# Patient Record
Sex: Male | Born: 1975
Health system: Southern US, Community
[De-identification: ages and names within clinical notes are randomized; demographics above are authoritative.]

## PROBLEM LIST (undated history)

## (undated) DIAGNOSIS — Z6828 Body mass index (BMI) 28.0-28.9, adult: Secondary | ICD-10-CM

## (undated) DIAGNOSIS — E785 Hyperlipidemia, unspecified: Secondary | ICD-10-CM

## (undated) DIAGNOSIS — E119 Type 2 diabetes mellitus without complications: Secondary | ICD-10-CM

## (undated) DIAGNOSIS — I10 Essential (primary) hypertension: Secondary | ICD-10-CM

## (undated) HISTORY — DX: Body mass index (bmi) 28.0-28.9, adult: Z68.28

## (undated) HISTORY — DX: Essential (primary) hypertension: I10

## (undated) HISTORY — DX: Hyperlipidemia, unspecified: E78.5

## (undated) HISTORY — DX: Type 2 diabetes mellitus without complications: E11.9

---

## 2010-09-18 ENCOUNTER — Other Ambulatory Visit: Payer: Self-pay | Admitting: Family Medicine

## 2010-09-18 ENCOUNTER — Ambulatory Visit
Admission: RE | Admit: 2010-09-18 | Discharge: 2010-09-18 | Payer: Self-pay | Source: Home / Self Care | Attending: Family Medicine | Admitting: Family Medicine

## 2010-09-18 DIAGNOSIS — J309 Allergic rhinitis, unspecified: Secondary | ICD-10-CM | POA: Insufficient documentation

## 2010-09-18 DIAGNOSIS — L708 Other acne: Secondary | ICD-10-CM | POA: Insufficient documentation

## 2010-09-18 LAB — CBC WITH DIFFERENTIAL/PLATELET
Basophils Relative: 0.8 % (ref 0.0–3.0)
Eosinophils Relative: 2.4 % (ref 0.0–5.0)
HCT: 45.9 % (ref 39.0–52.0)
Hemoglobin: 15.6 g/dL (ref 13.0–17.0)
Lymphocytes Relative: 36.4 % (ref 12.0–46.0)
Lymphs Abs: 3.1 10*3/uL (ref 0.7–4.0)
Monocytes Relative: 6.5 % (ref 3.0–12.0)
Neutro Abs: 4.6 10*3/uL (ref 1.4–7.7)
RBC: 5.4 Mil/uL (ref 4.22–5.81)

## 2010-09-18 LAB — HEPATIC FUNCTION PANEL
ALT: 35 U/L (ref 0–53)
AST: 32 U/L (ref 0–37)
Alkaline Phosphatase: 59 U/L (ref 39–117)
Bilirubin, Direct: 0.1 mg/dL (ref 0.0–0.3)
Total Protein: 7.4 g/dL (ref 6.0–8.3)

## 2010-09-18 LAB — BASIC METABOLIC PANEL
Calcium: 9.8 mg/dL (ref 8.4–10.5)
GFR: 105.71 mL/min (ref >60.00)
Potassium: 4.3 mEq/L (ref 3.5–5.1)
Sodium: 142 mEq/L (ref 135–145)

## 2010-09-18 LAB — LIPID PANEL
HDL: 45.3 mg/dL (ref >39.00)
Total CHOL/HDL Ratio: 5
Triglycerides: 254 mg/dL — ABNORMAL HIGH (ref 0.0–149.0)

## 2010-09-18 LAB — CONVERTED CEMR LAB
Bilirubin Urine: NEGATIVE
Nitrite: NEGATIVE
Protein, U semiquant: NEGATIVE
Urobilinogen, UA: 0.2
WBC Urine, dipstick: NEGATIVE

## 2010-09-24 NOTE — Assessment & Plan Note (Signed)
Summary: NEW TO EST---REQUESTING CPX//CCM//pt resc/ccm   Vital Signs:  Patient profile:   35 year old male Height:      67.5 inches Weight:      186 pounds BMI:     28.81 Temp:     98.8 degrees F oral BP sitting:   124 / 84  (left arm) Cuff size:   regular  Vitals Entered By: Kern Reap CMA Duncan Dull) (September 18, 2010 3:02 PM) CC: new to establish care, left ear clogged Is Patient Diabetic? No Pain Assessment Patient in pain? no        CC:  new to establish care and left ear clogged.  History of Present Illness: Martin Sanchez is a 35 year old, married male, nonsmoker, who comes in today as a new patient for general medical exam,  [facility], [facility] problems.  It one time when he was playing soccer in [place].  He fractured his left elbow had surgery.  He does have some allergic rhinitis, which he takes Zyrtec, and steroid nasal spray p.r.n..  Review of systems otherwise negative.  Family history pertinent.  His father has diabetes.  Tetanus 2007.  Social history married one child .......Art gallery manager  by trade  He also has adult acne  Preventive Screening-Counseling & Management  Alcohol-Tobacco     Smoking Status: never      Drug Use:  no.    Past History:  Past medical, surgical, family and social histories (including risk factors) reviewed, and no changes noted (except as noted below).  Past Surgical History: left elbow surgery  Family History: Reviewed history and no changes required. Father: dabetes Mother: thyroid, low blood pressure Siblings: 1  brother  Social History: Reviewed history and no changes required. Occupation:engineer  Married Never Smoked Alcohol use-no Drug use-no Smoking Status:  never Drug Use:  no  Review of Systems      See HPI  Physical Exam  General:  Well-developed,well-nourished,in no acute distress; alert,appropriate and cooperative throughout examination Head:  Normocephalic and  atraumatic without obvious abnormalities. No apparent alopecia or balding. Eyes:  No corneal or conjunctival inflammation noted. EOMI. Perrla. Funduscopic exam benign, without hemorrhages, exudates or papilledema. Vision grossly normal. Ears:  External ear exam shows no significant lesions or deformities.  Otoscopic examination reveals clear canals, tympanic membranes are intact bilaterally without bulging, retraction, inflammation or discharge. Hearing is grossly normal bilaterally. Nose:  External nasal examination shows no deformity or inflammation. Nasal mucosa are pink and moist without lesions or exudates. Mouth:  Oral mucosa and oropharynx without lesions or exudates.  Teeth in good repair. Neck:  No deformities, masses, or tenderness noted. Chest Wall:  No deformities, masses, tenderness or gynecomastia noted. Breasts:  No masses or gynecomastia noted Lungs:  Normal respiratory effort, chest expands symmetrically. Lungs are clear to auscultation, no crackles or wheezes. Heart:  Normal rate and regular rhythm. S1 and S2 normal without gallop, murmur, click, rub or other extra sounds. Abdomen:  Bowel sounds positive,abdomen soft and non-tender without masses, organomegaly or hernias noted. Rectal:  No external abnormalities noted. Normal sphincter tone. No rectal masses or tenderness. Msk:  No deformity or scoliosis noted of thoracic or lumbar spine.   Pulses:  R and L carotid,radial,femoral,dorsalis pedis and posterior tibial pulses are full and equal bilaterally Extremities:  No clubbing, cyanosis, edema, or deformity noted with normal full range of motion of all joints.   Neurologic:  No cranial nerve deficits noted. Station and gait are normal. Plantar  reflexes are down-going bilaterally. DTRs are symmetrical throughout. Sensory, motor and coordinative functions appear intact. Skin:  Intact without suspicious lesions or rashes Cervical Nodes:  No lymphadenopathy noted Axillary Nodes:  No  palpable lymphadenopathy Inguinal Nodes:  No significant adenopathy Psych:  Cognition and judgment appear intact. Alert and cooperative with normal attention span and concentration. No apparent delusions, illusions, hallucinations   Problems:  Medical Problems Added: 1)  Dx of Acne Nec  (ICD-706.1) 2)  Dx of Routine General Medical Exam@[facility]  (ICD-V70.0) 3)  Dx of Rhinitis  (ICD-477.9)  Impression & Recommendations:  Problem # 1:  Preventive Health Care (ICD-V70.0) Assessment New  Problem # 2:  RHINITIS (ICD-477.9) Assessment: New  Orders: Venipuncture (40102) Prescription Created Electronically [phone]) UA Dipstick w/o Micro (automated)  (81003) Specimen Handling (64403) TLB-Lipid Panel (80061-LIPID) TLB-BMP (Basic Metabolic Panel-BMET) (80048-METABOL) TLB-CBC Platelet - w/Differential (85025-CBCD) TLB-TSH (Thyroid Stimulating Hormone) (84443-TSH) TLB-Hepatic/Liver Function Pnl (80076-HEPATIC)  His updated medication list for this problem includes:    Flonase 50 Mcg/act Susp (Fluticasone propionate) ..... Uad  Problem # 3:  ACNE NEC (ICD-706.1) Assessment: New  His updated medication list for this problem includes:    Doxycycline Hyclate 100 Mg Caps (Doxycycline hyclate) .Marland Kitchen... Take 1 tablet by mouth two times a day as needed adult acne  Complete Medication List: 1)  Zyrtec-d Allergy & Congestion 5-120 Mg Xr12h-tab (Cetirizine-pseudoephedrine) .... Once daily 2)  Flonase 50 Mcg/act Susp (Fluticasone propionate) .... Uad 3)  Doxycycline Hyclate 100 Mg Caps (Doxycycline hyclate) .... Take 1 tablet by mouth two times a day as needed adult acne  Patient Instructions: 1)  take 10 mg plain Zyrtec at bedtime, along with one shot of steroid nasal spray up each nostril at bedtime.............. after a couple weeks, when y ear  opens,  stop the steroid nasal spray ,but  continue the Zyrtec nightly at bedtime.  All year round 2)  Please schedule a follow-up appointment  in 1 year. 3)  I will call you the results of your lab work Prescriptions: DOXYCYCLINE HYCLATE 100 MG CAPS (DOXYCYCLINE HYCLATE) Take 1 tablet by mouth two times a day as needed adult acne  #100 x 5   Entered and Authorized by:   Roderick Pee MD   Signed by:   Roderick Pee MD on 09/18/2010   Method used:   Print then Give to Patient   RxID:   [id number] FLONASE 50 MCG/ACT SUSP (FLUTICASONE PROPIONATE) UAD  #2 x 6   Entered and Authorized by:   Roderick Pee MD   Signed by:   Roderick Pee MD on 09/18/2010   Method used:   Print then Give to Patient   RxID:   [id number]    Orders Added: 1)  Venipuncture [01093] 2)  Prescription Created Electronically [G8553] 3)  New Patient 18-39 years [99385] 4)  UA Dipstick w/o Micro (automated)  [81003] 5)  Specimen Handling [99000] 6)  TLB-Lipid Panel [80061-LIPID] 7)  TLB-BMP (Basic Metabolic Panel-BMET) [80048-METABOL] 8)  TLB-CBC Platelet - w/Differential [85025-CBCD] 9)  TLB-TSH (Thyroid Stimulating Hormone) [84443-TSH] 10)  TLB-Hepatic/Liver Function Pnl [80076-HEPATIC]    Laboratory Results   Urine Tests    Routine Urinalysis   Color: yellow Appearance: Clear Glucose: negative   (Normal Range: Negative) Bilirubin: negative   (Normal Range: Negative) Ketone: negative   (Normal Range: Negative) Spec. Gravity: 1.020   (Normal Range: 1.003-1.035) Blood: negative   (Normal Range: Negative) pH: 8.5   (Normal Range: 5.0-8.0) Protein: negative   (  Normal Range: Negative) Urobilinogen: 0.2   (Normal Range: 0-1) Nitrite: negative   (Normal Range: Negative) Leukocyte Esterace: negative   (Normal Range: Negative)    Comments: Rita Ohara  September 18, 2010 4:04 PM

## 2010-10-23 ENCOUNTER — Ambulatory Visit (INDEPENDENT_AMBULATORY_CARE_PROVIDER_SITE_OTHER): Payer: BC Managed Care – PPO | Admitting: Internal Medicine

## 2010-10-23 ENCOUNTER — Encounter: Payer: Self-pay | Admitting: Internal Medicine

## 2010-10-23 DIAGNOSIS — J40 Bronchitis, not specified as acute or chronic: Secondary | ICD-10-CM

## 2010-10-23 NOTE — Assessment & Plan Note (Signed)
Recommend continuation of current abx to completion. Discussed otc symptomatic relief prn. Followup if no improvement or worsening.

## 2010-10-23 NOTE — Progress Notes (Signed)
  Subjective:    Patient ID: Martin Sanchez, male    DOB: Aug 07, 1976, 35 y.o.   MRN: [id number]  HPI  Pt presents to clinic for evaluation of cough. Notes 5d h/o cough productive for green sputum, sore throat, subjective fever without chills and +sick exposure. Seen several days ago at [facility] and placed on 10d course of amoxicillin. Compliant with medication without adverse effect. No alleviating or exacerbating factors. No dyspnea or wheezing. No other complaint.  Reviewed PMH, medications and allergies    Review of Systems see HPI     Objective:   Physical Exam  Nursing note and vitals reviewed. Constitutional: He appears well-developed and well-nourished. No distress.  HENT:  Head: Normocephalic and atraumatic.  Right Ear: Tympanic membrane and ear canal normal.  Left Ear: Tympanic membrane and ear canal normal.  Nose: Nose normal.  Mouth/Throat: Mucous membranes are not pale and not dry. Posterior oropharyngeal erythema present. No oropharyngeal exudate, posterior oropharyngeal edema or tonsillar abscesses.  Eyes: Conjunctivae are normal. Right eye exhibits no discharge. Left eye exhibits no discharge. No scleral icterus.  Cardiovascular: Normal rate, regular rhythm and normal heart sounds.   Pulmonary/Chest: Effort normal and breath sounds normal. No respiratory distress. He has no wheezes. He has no rales.  Neurological: He is alert.  Skin: Skin is warm and dry. No rash noted. He is not diaphoretic. No erythema.          Assessment & Plan:

## 2010-10-30 ENCOUNTER — Ambulatory Visit (INDEPENDENT_AMBULATORY_CARE_PROVIDER_SITE_OTHER): Payer: BC Managed Care – PPO | Admitting: Internal Medicine

## 2010-10-30 ENCOUNTER — Encounter: Payer: Self-pay | Admitting: Internal Medicine

## 2010-10-30 ENCOUNTER — Ambulatory Visit (INDEPENDENT_AMBULATORY_CARE_PROVIDER_SITE_OTHER)
Admission: RE | Admit: 2010-10-30 | Discharge: 2010-10-30 | Disposition: A | Discharge status: Home / Self Care | Payer: BC Managed Care – PPO | Source: Ambulatory Visit | Attending: Internal Medicine | Admitting: Internal Medicine

## 2010-10-30 ENCOUNTER — Telehealth: Payer: Self-pay

## 2010-10-30 DIAGNOSIS — J40 Bronchitis, not specified as acute or chronic: Secondary | ICD-10-CM

## 2010-10-30 DIAGNOSIS — R042 Hemoptysis: Secondary | ICD-10-CM

## 2010-10-30 DIAGNOSIS — R059 Cough, unspecified: Secondary | ICD-10-CM

## 2010-10-30 DIAGNOSIS — R05 Cough: Secondary | ICD-10-CM

## 2010-10-30 MED ORDER — LEVOFLOXACIN 500 MG PO TABS: 500.0000 mg | ORAL_TABLET | Freq: Every day | ORAL | Status: AC

## 2010-10-30 MED ORDER — DICLOFENAC SODIUM 75 MG PO TBEC: DELAYED_RELEASE_TABLET | ORAL | Status: DC

## 2010-10-30 NOTE — Telephone Encounter (Signed)
Per Dr. Rodena Medin, X-ray normal. Pt's wife is aware

## 2010-10-31 ENCOUNTER — Encounter: Payer: Self-pay | Admitting: Internal Medicine

## 2010-10-31 DIAGNOSIS — R059 Cough, unspecified: Secondary | ICD-10-CM | POA: Insufficient documentation

## 2010-10-31 DIAGNOSIS — R05 Cough: Secondary | ICD-10-CM | POA: Insufficient documentation

## 2010-10-31 NOTE — Progress Notes (Signed)
  Subjective:    Patient ID: Martin Sanchez, male    DOB: July 13, 1976, 35 y.o.   MRN: [id number]  HPI Pt presents to clinic for re-evaluation of cough. Recently seen for possible bronchitis. Completes 10d course of amox today and has noted clinical improvement including less cough. However did see pink sputum yesterday and this am. Further sputum has been clear. Denies gross active bleeding. No f/c, dyspnea or wheeze. No other complaints.  Reviewed pmh, medications and allergies.    Review of Systems  Constitutional: Negative for fever, chills and fatigue.  HENT: Negative for facial swelling and ear discharge.   Eyes: Negative for discharge and redness.  Respiratory: Positive for cough. Negative for shortness of breath and wheezing.   Cardiovascular: Negative for chest pain.       Objective:   Physical Exam  Nursing note and vitals reviewed. Constitutional: He appears well-developed and well-nourished. No distress.  HENT:  Head: Normocephalic and atraumatic.  Right Ear: External ear normal.  Left Ear: External ear normal.  Nose: Nose normal.  Mouth/Throat: Oropharynx is clear and moist. No oropharyngeal exudate.  Eyes: Conjunctivae are normal. Right eye exhibits no discharge. Left eye exhibits no discharge. No scleral icterus.  Neck: Neck supple.  Cardiovascular: Normal rate, regular rhythm and normal heart sounds.  Exam reveals no gallop and no friction rub.   No murmur heard. Pulmonary/Chest: Effort normal and breath sounds normal. No respiratory distress. He has no wheezes. He has no rales.  Lymphadenopathy:    He has no cervical adenopathy.  Neurological: He is alert.  Skin: Skin is warm and dry. He is not diaphoretic.          Assessment & Plan:

## 2010-10-31 NOTE — Assessment & Plan Note (Signed)
Clinically improving sx of suspected bronchitis however now with possible minimal hemoptysis. Obtain CXR and begin course of levaquin. Followup closely if no improvement or worsening.

## 2010-11-24 ENCOUNTER — Encounter: Payer: Self-pay | Admitting: Family Medicine

## 2010-11-24 ENCOUNTER — Ambulatory Visit (INDEPENDENT_AMBULATORY_CARE_PROVIDER_SITE_OTHER): Payer: BC Managed Care – PPO | Admitting: Family Medicine

## 2010-11-24 DIAGNOSIS — G5601 Carpal tunnel syndrome, right upper limb: Secondary | ICD-10-CM

## 2010-11-24 DIAGNOSIS — G5600 Carpal tunnel syndrome, unspecified upper limb: Secondary | ICD-10-CM

## 2010-11-24 NOTE — Patient Instructions (Signed)
Take Motrin, 600 mg twice daily with food.  Short arm splint for carpal tunnel syndrome 24/7   If in 4 to 6 weeks.  The symptoms do not improve I would call Dr. Cleone Slim sypher.  The hand specialist

## 2010-11-24 NOTE — Progress Notes (Signed)
  Subjective:    Patient ID: Martin Sanchez, male    DOB: 01-Aug-1976, 35 y.o.   MRN: [id number]  Martin Sanchez Is a 35 year old male, who comes in today for evaluation of numbness in the fifth finger of his right hand x 2 to 3 weeks.  His head is prominent for age 60 and an elastic splint and it went away.  At this time.  The tingling will go away.  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  Neurologic examination of the right hand and arm are normal    Assessment & Plan:  Early carpal tunnel syndrome,,,,,,,,, short arm continuous splint.  Motrin 600 b.i.d. Return p.r.n.

## 2010-12-25 ENCOUNTER — Other Ambulatory Visit: Payer: Self-pay | Admitting: *Deleted

## 2010-12-25 MED ORDER — FLUTICASONE PROPIONATE 50 MCG/ACT NA SUSP: 2.0000 | Freq: Every day | NASAL | Status: DC

## 2011-05-05 ENCOUNTER — Other Ambulatory Visit: Payer: Self-pay | Admitting: Orthopedic Surgery

## 2011-05-05 DIAGNOSIS — M542 Cervicalgia: Secondary | ICD-10-CM

## 2011-05-10 ENCOUNTER — Ambulatory Visit
Admission: RE | Admit: 2011-05-10 | Discharge: 2011-05-10 | Disposition: A | Discharge status: Home / Self Care | Payer: BC Managed Care – PPO | Source: Ambulatory Visit | Attending: Orthopedic Surgery | Admitting: Orthopedic Surgery

## 2011-05-10 DIAGNOSIS — M542 Cervicalgia: Secondary | ICD-10-CM

## 2013-08-16 ENCOUNTER — Encounter (HOSPITAL_COMMUNITY): Payer: Self-pay | Admitting: Emergency Medicine

## 2013-08-16 ENCOUNTER — Emergency Department (HOSPITAL_COMMUNITY)
Admission: EM | Admit: 2013-08-16 | Discharge: 2013-08-16 | Disposition: A | Discharge status: Home / Self Care | Payer: BC Managed Care – PPO | Attending: Emergency Medicine | Admitting: Emergency Medicine

## 2013-08-16 DIAGNOSIS — J069 Acute upper respiratory infection, unspecified: Secondary | ICD-10-CM

## 2013-08-16 DIAGNOSIS — R42 Dizziness and giddiness: Secondary | ICD-10-CM | POA: Insufficient documentation

## 2013-08-16 DIAGNOSIS — R0602 Shortness of breath: Secondary | ICD-10-CM | POA: Insufficient documentation

## 2013-08-16 DIAGNOSIS — R000 Tachycardia, unspecified: Secondary | ICD-10-CM | POA: Insufficient documentation

## 2013-08-16 DIAGNOSIS — Z79899 Other long term (current) drug therapy: Secondary | ICD-10-CM | POA: Insufficient documentation

## 2013-08-16 NOTE — ED Provider Notes (Signed)
CSN: [id number]     Arrival date & time 08/16/13  7829 History  This chart was scribed for non-physician practitioner, Johnnette Gourd, PA-C working with Glynn Octave, MD by Greggory Stallion, ED scribe. This patient was seen in room TR06C/TR06C and the patient's care was started at 9:58 AM.   Chief Complaint  Patient presents with  . URI   The history is provided by the patient. No language interpreter was used.   HPI Comments: Martin Sanchez is a 37 y.o. male who presents to the Emergency Department complaining of nonproductive cough, congestion, generalized body aches and chills that started 3 days ago. He started feeling SOB and light headedness this morning. States he had subjective fever last night but denies it currently. Pt has taken zyrtec and DayQuil with some relief. He has not gotten a flu shot this year.   History reviewed. No pertinent past medical history. History reviewed. No pertinent past surgical history. History reviewed. No pertinent family history. History  Substance Use Topics  . Smoking status: Never Smoker   . Smokeless tobacco: Not on file  . Alcohol Use: Yes    Review of Systems  Constitutional: Positive for chills and fatigue. Negative for fever.  HENT: Positive for congestion.   Respiratory: Positive for cough and shortness of breath.   Neurological: Positive for light-headedness.  All other systems reviewed and are negative.   Allergies  Review of patient's allergies indicates no known allergies.  Home Medications   Current Outpatient Rx  Name  Route  Sig  Dispense  Refill  . cetirizine (ZYRTEC) 10 MG tablet   Oral   Take 10 mg by mouth daily.           . Pseudoephedrine-APAP-DM (DAYQUIL MULTI-SYMPTOM COLD/FLU PO)   Oral   Take 15 mLs by mouth daily as needed (for flu symptoms).          BP 154/102  Pulse 104  Temp(Src) 99.6 F (37.6 C) (Oral)  Resp 16  SpO2 100%  Physical Exam  Nursing note and vitals reviewed. Constitutional: He is  oriented to person, place, and time. He appears well-developed and well-nourished. No distress.  HENT:  Head: Normocephalic and atraumatic.  Right Ear: Tympanic membrane and ear canal normal.  Left Ear: Tympanic membrane and ear canal normal.  Nose: Mucosal edema present.  Mouth/Throat: Posterior oropharyngeal erythema present. No posterior oropharyngeal edema.  Post nasal drip.   Eyes: Conjunctivae and EOM are normal.  Neck: Normal range of motion. Neck supple.  Cardiovascular: Regular rhythm and normal heart sounds.  Tachycardia present.   Tachy ~ 100  Pulmonary/Chest: Effort normal and breath sounds normal.  Musculoskeletal: Normal range of motion. He exhibits no edema.  Neurological: He is alert and oriented to person, place, and time.  Skin: Skin is warm and dry.  Psychiatric: He has a normal mood and affect. His behavior is normal.    ED Course  Procedures (including critical care time)  DIAGNOSTIC STUDIES: Oxygen Saturation is 100% on RA, normal by my interpretation.    COORDINATION OF CARE: 9:59 AM-Discussed treatment plan which includes ibuprofen and Mucinex with pt at bedside and pt agreed to plan.   Labs Review Labs Reviewed - No data to display Imaging Review No results found.  EKG Interpretation   None       MDM   1. URI (upper respiratory infection)    Patient well-appearing in no apparent distress. Mildly febrile at 99.6 and tachy ~100. Lungs clear. O2 sat  100% on room air. I discussed symptomatic treatment. Return precautions given. Patient states understanding of plan and is agreeable.  I personally performed the services described in this documentation, which was scribed in my presence. The recorded information has been reviewed and is accurate.   Trevor Mace, PA-C 08/16/13 1041

## 2013-08-16 NOTE — ED Provider Notes (Signed)
Medical screening examination/treatment/procedure(s) were performed by non-physician practitioner and as supervising physician I was immediately available for consultation/collaboration.  EKG Interpretation   None         Jaystin Mcgarvey, MD 08/16/13 1536 

## 2013-08-16 NOTE — ED Notes (Addendum)
Pt c/o dry cough, congestion, body aches and chills x 3 days. He tried zyrtec and dayquil with some relief

## 2013-08-20 ENCOUNTER — Telehealth: Payer: Self-pay | Admitting: Family Medicine

## 2013-08-20 NOTE — Telephone Encounter (Signed)
Pt wife called and stated that the pt was seen in the ED and is experiencing severe acid reflux since his discharge. She would like to bring him in sooner than 7-14 days. Please advise.

## 2013-08-20 NOTE — Telephone Encounter (Signed)
Please add patient to 08/22/13 at 11:15 Dr Nelida Meuse schedule. Patient is aware.

## 2013-08-22 ENCOUNTER — Ambulatory Visit (INDEPENDENT_AMBULATORY_CARE_PROVIDER_SITE_OTHER): Payer: BC Managed Care – PPO | Admitting: Family Medicine

## 2013-08-22 ENCOUNTER — Encounter: Payer: Self-pay | Admitting: Family Medicine

## 2013-08-22 VITALS — BP 110/70 | Temp 98.4°F | Wt 190.0 lb

## 2013-08-22 DIAGNOSIS — R05 Cough: Secondary | ICD-10-CM

## 2013-08-22 DIAGNOSIS — R059 Cough, unspecified: Secondary | ICD-10-CM

## 2013-08-22 NOTE — Progress Notes (Signed)
   Subjective:    Patient ID: Martin Sanchez, male    DOB: [birth year], 37 y.o.   MRN: [id number]  HPI Martin Sanchez is a 37 year old married male nonsmoker who comes in today for evaluation of multiple issues  He tells me that on Christmas Day [facility] went [facility]. He had a physical exam was told he had a cold and was treated symptomatically. The next day he didn't feel any worse but he went to [facility] urgent care. There he was given a Z-Pak and and prednisone. He then developed side effects from the medication including abdominal pain and constipation. He stopped those medications and on Monday, December 29 went to [facility] urgent care. Examination at that facility was negative except for constipation and reflux esophagitis secondary to the medication he been given at another urgent care. He was therefore treated symptomatically with OTC Prilosec and stool softeners. He comes in today to get checked  He has no fever earache sore throat nausea vomiting or diarrhea. He does have a slight cough nonproductive no wheezing states she slept well all night   Review of Systems Review of systems negative    Objective:   Physical Exam Well-developed well-nourished male no acute distress HEENT negative neck was supple no adenopathy lungs are clear      Assessment & Plan:  Viral syndrome plan treat symptomatically  Constipation secondary to medication treat symptomatically  Reflux esophagitis secondary to medication treat with over-the-counter Prilosec

## 2013-08-22 NOTE — Patient Instructions (Signed)
Drink lots of liquids  Tylenol,,,,,,,,, 2 tabs 3 times daily when necessary  Protonix,,,,,,,, or over-the-counter Prilosec,,,,,,, take one daily for the upset stomach  For the constipation and takes milk of magnesia, prune juice, MiraLax until the constipation is resolved  Return when necessary,

## 2013-10-18 ENCOUNTER — Other Ambulatory Visit: Payer: BC Managed Care – PPO

## 2013-10-19 ENCOUNTER — Other Ambulatory Visit (INDEPENDENT_AMBULATORY_CARE_PROVIDER_SITE_OTHER): Payer: BC Managed Care – PPO

## 2013-10-19 DIAGNOSIS — Z0000 Encounter for general adult medical examination without abnormal findings: Secondary | ICD-10-CM

## 2013-10-19 LAB — BASIC METABOLIC PANEL
BUN: 9 mg/dL (ref 6–23)
CHLORIDE: 101 meq/L (ref 96–112)
CO2: 30 meq/L (ref 19–32)
CREATININE: 0.9 mg/dL (ref 0.4–1.5)
Calcium: 9.5 mg/dL (ref 8.4–10.5)
GFR: 101.26 mL/min (ref >60.00)
Glucose, Bld: 83 mg/dL (ref 70–99)
POTASSIUM: 4.4 meq/L (ref 3.5–5.1)
Sodium: 137 mEq/L (ref 135–145)

## 2013-10-19 LAB — CBC WITH DIFFERENTIAL/PLATELET
BASOS PCT: 0.7 % (ref 0.0–3.0)
Basophils Absolute: 0 10*3/uL (ref 0.0–0.1)
Eosinophils Absolute: 0.3 10*3/uL (ref 0.0–0.7)
Eosinophils Relative: 3.7 % (ref 0.0–5.0)
HCT: 50.1 % (ref 39.0–52.0)
Hemoglobin: 16.3 g/dL (ref 13.0–17.0)
LYMPHS PCT: 41.7 % (ref 12.0–46.0)
Lymphs Abs: 3 10*3/uL (ref 0.7–4.0)
MCHC: 32.5 g/dL (ref 30.0–36.0)
MCV: 85.3 fl (ref 78.0–100.0)
MONOS PCT: 5 % (ref 3.0–12.0)
Monocytes Absolute: 0.4 10*3/uL (ref 0.1–1.0)
NEUTROS ABS: 3.5 10*3/uL (ref 1.4–7.7)
NEUTROS PCT: 48.9 % (ref 43.0–77.0)
Platelets: 331 10*3/uL (ref 150.0–400.0)
RBC: 5.87 Mil/uL — AB (ref 4.22–5.81)
RDW: 13.7 % (ref 11.5–14.6)
WBC: 7.1 10*3/uL (ref 4.5–10.5)

## 2013-10-19 LAB — POCT URINALYSIS DIPSTICK
BILIRUBIN UA: NEGATIVE
Blood, UA: NEGATIVE
GLUCOSE UA: NEGATIVE
KETONES UA: NEGATIVE
LEUKOCYTES UA: NEGATIVE
NITRITE UA: NEGATIVE
PH UA: 7
Protein, UA: NEGATIVE
Spec Grav, UA: 1.01
Urobilinogen, UA: 0.2

## 2013-10-19 LAB — LIPID PANEL
CHOLESTEROL: 221 mg/dL — AB (ref 0–200)
HDL: 45.4 mg/dL (ref >39.00)
TRIGLYCERIDES: 195 mg/dL — AB (ref 0.0–149.0)
Total CHOL/HDL Ratio: 5
VLDL: 39 mg/dL (ref 0.0–40.0)

## 2013-10-19 LAB — TSH: TSH: 2.24 u[IU]/mL (ref 0.35–5.50)

## 2013-10-19 LAB — HEPATIC FUNCTION PANEL
ALBUMIN: 4.3 g/dL (ref 3.5–5.2)
ALT: 45 U/L (ref 0–53)
AST: 33 U/L (ref 0–37)
Alkaline Phosphatase: 54 U/L (ref 39–117)
Bilirubin, Direct: 0 mg/dL (ref 0.0–0.3)
TOTAL PROTEIN: 7.8 g/dL (ref 6.0–8.3)
Total Bilirubin: 0.7 mg/dL (ref 0.3–1.2)

## 2013-10-19 LAB — LDL CHOLESTEROL, DIRECT: Direct LDL: 146 mg/dL

## 2013-10-29 ENCOUNTER — Encounter: Payer: BC Managed Care – PPO | Admitting: Family Medicine

## 2013-10-29 ENCOUNTER — Ambulatory Visit (INDEPENDENT_AMBULATORY_CARE_PROVIDER_SITE_OTHER): Payer: BC Managed Care – PPO | Admitting: Family Medicine

## 2013-10-29 ENCOUNTER — Encounter: Payer: Self-pay | Admitting: Family Medicine

## 2013-10-29 VITALS — BP 140/80 | HR 66 | Temp 98.3°F | Resp 20 | Ht 67.5 in | Wt 192.0 lb

## 2013-10-29 DIAGNOSIS — Z0000 Encounter for general adult medical examination without abnormal findings: Secondary | ICD-10-CM | POA: Insufficient documentation

## 2013-10-29 DIAGNOSIS — J309 Allergic rhinitis, unspecified: Secondary | ICD-10-CM

## 2013-10-29 NOTE — Progress Notes (Signed)
   Subjective:    Patient ID: Martin Sanchez, male    DOB: [birth year], 38 y.o.   MRN: [id number]  HPI Martin Sanchez is a 38 year old married male nonsmoker who comes in today for general physical examination  He takes Zyrtec when necessary for allergic rhinitis Protonix 40 mg when necessary for reflux. He one time he had a bad viral infection we gave him some albuterol because he was wheezing. He's not using albuterol anymore.  He does not get routine eye care recommended Dr. Hazle Quantigby. He gets regular dental care. No family history of colon cancer polyps or for colonoscopy at age 38  Last year we started carpal tunnel syndrome because he has some numbness in his fingers. Consultation review of the C5-C6 disc. 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 He had physical therapy and that is much improved.   Review of Systems  Constitutional: Negative.   HENT: Negative.   Eyes: Negative.   Respiratory: Negative.   Cardiovascular: Negative.   Gastrointestinal: Negative.   Genitourinary: Negative.   Musculoskeletal: Negative.   Skin: Negative.   Neurological: Negative.   Psychiatric/Behavioral: Negative.        Objective:   Physical Exam  Constitutional: He is oriented to person, place, and time. He appears well-developed and well-nourished.  HENT:  Head: Normocephalic and atraumatic.  Right Ear: External ear normal.  Left Ear: External ear normal.  Nose: Nose normal.  Mouth/Throat: Oropharynx is clear and moist.  Eyes: Conjunctivae and EOM are normal. Pupils are equal, round, and reactive to light.  Neck: Normal range of motion. Neck supple. No JVD present. No tracheal deviation present. No thyromegaly present.  Cardiovascular: Normal rate, regular rhythm, normal heart sounds and intact distal pulses.  Exam reveals no gallop and no friction rub.   No murmur heard. Pulmonary/Chest: Effort normal and breath sounds normal. No stridor. No respiratory distress. He has no wheezes.  He has no rales. He exhibits no tenderness.  Abdominal: Soft. Bowel sounds are normal. He exhibits no distension and no mass. There is no tenderness. There is no rebound and no guarding.  Genitourinary: Penis normal. No penile tenderness.  Musculoskeletal: Normal range of motion. He exhibits no edema and no tenderness.  Lymphadenopathy:    He has no cervical adenopathy.  Neurological: He is alert and oriented to person, place, and time. He has normal reflexes. No cranial nerve deficit. He exhibits normal muscle tone.  Skin: Skin is warm and dry. No rash noted. No erythema. No pallor.  Psychiatric: He has a normal mood and affect. His behavior is normal. Judgment and thought content normal.          Assessment & Plan:  Healthy male  Allergic rhinitis Zyrtec when necessary  Occasional reflux Protonix or OTC a metabolic salt when necessary  A2-Z3C5-C6 disc problem...Marland Kitchen.Marland Kitchen.. 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

## 2013-10-29 NOTE — Progress Notes (Signed)
Pre-visit discussion using our clinic review tool. No additional management support is needed unless otherwise documented below in the visit note.  

## 2013-10-29 NOTE — Patient Instructions (Signed)
Zyrtec 10 mg......Marland Kitchen. 1 at bedtime when necessary for allergy symptoms  Proton X........Marland Kitchen. or omeprazole......... OTC for symptoms of reflux  Return in one year for general physical examination sooner if any problem

## 2014-02-19 ENCOUNTER — Telehealth: Payer: Self-pay | Admitting: Family Medicine

## 2014-02-19 NOTE — Telephone Encounter (Signed)
Pt would like a letter stating he had a complete cpe.. Pt needs for his insurance company

## 2014-02-21 NOTE — Telephone Encounter (Signed)
Pt notified, letter typed and placed up front for pickup

## 2014-07-31 ENCOUNTER — Encounter (HOSPITAL_COMMUNITY): Payer: Self-pay | Admitting: *Deleted

## 2014-07-31 ENCOUNTER — Emergency Department (HOSPITAL_COMMUNITY)
Admission: EM | Admit: 2014-07-31 | Discharge: 2014-07-31 | Disposition: A | Discharge status: Home / Self Care | Payer: BC Managed Care – PPO | Source: Home / Self Care | Attending: Emergency Medicine | Admitting: Emergency Medicine

## 2014-07-31 DIAGNOSIS — H1132 Conjunctival hemorrhage, left eye: Secondary | ICD-10-CM

## 2014-07-31 DIAGNOSIS — B9789 Other viral agents as the cause of diseases classified elsewhere: Secondary | ICD-10-CM

## 2014-07-31 DIAGNOSIS — J069 Acute upper respiratory infection, unspecified: Secondary | ICD-10-CM

## 2014-07-31 NOTE — Discharge Instructions (Signed)
Use over-the-counter ketotifen drops as needed for any eye itching   Subconjunctival Hemorrhage A subconjunctival hemorrhage is a bright red patch covering a portion of the white of the eye. The white part of the eye is called the sclera, and it is covered by a thin membrane called the conjunctiva. This membrane is clear, except for tiny blood vessels that you can see with the naked eye. When your eye is irritated or inflamed and becomes red, it is because the vessels in the conjunctiva are swollen. Sometimes, a blood vessel in the conjunctiva can break and bleed. When this occurs, the blood builds up between the conjunctiva and the sclera, and spreads out to create a red area. The red spot may be very small at first. It may then spread to cover a larger part of the surface of the eye, or even all of the visible white part of the eye. In almost all cases, the blood will go away and the eye will become white again. Before completely dissolving, however, the red area may spread. It may also become brownish-yellow in color before going away. If a lot of blood collects under the conjunctiva, it may look like a bulge on the surface of the eye. This looks scary, but it will also eventually flatten out and go away. Subconjunctival hemorrhages do not cause pain, but if swollen, may cause a feeling of irritation. There is no effect on vision.  CAUSES   The most common cause is mild trauma (rubbing the eye, irritation).  Subconjunctival hemorrhages can happen because of coughing or straining (lifting heavy objects), vomiting, or sneezing.  In some cases, your doctor may want to check your blood pressure. High blood pressure can also cause a subconjunctival hemorrhage.  Severe trauma or blunt injuries.  Diseases that affect blood clotting (hemophilia, leukemia).  Abnormalities of blood vessels behind the eye (carotid cavernous sinus fistula).  Tumors behind the eye.  Certain drugs (aspirin, Coumadin,  heparin).  Recent eye surgery. HOME CARE INSTRUCTIONS   Do not worry about the appearance of your eye. You may continue your usual activities.  Often, follow-up is not necessary. SEEK MEDICAL CARE IF:   Your eye becomes painful.  The bleeding does not disappear within 3 weeks.  Bleeding occurs elsewhere, for example, under the skin, in the mouth, or in the other eye.  You have recurring subconjunctival hemorrhages. SEEK IMMEDIATE MEDICAL CARE IF:   Your vision changes or you have difficulty seeing.  You develop a severe headache, persistent vomiting, confusion, or abnormal drowsiness (lethargy).  Your eye seems to bulge or protrude from the eye socket.  You notice the sudden appearance of bruises or have spontaneous bleeding elsewhere on your body. Document Released: 08/09/2005 Document Revised: 12/24/2013 Document Reviewed: 07/07/2009 [facility] Patient Information 2015 [place], [place]. This information is not intended to replace advice given to you by your health care provider. Make sure you discuss any questions you have with your health care provider.

## 2014-07-31 NOTE — ED Notes (Signed)
Pt has  symptoms of  A  Redness  To his  l  Eye     -he  States   yest  He  Had  Symptoms of  A  URI    And  Has  Been taking  Nyquil      - he  Reports  He  Woke  Up  This  Am  With the  Redness    He  denys  Any  Pain  Or    Blurred  Vision   He  Reports  Perhaps a  Mild  Sensation of  Some  Irritation  Only

## 2014-07-31 NOTE — ED Provider Notes (Signed)
CSN: [id number]     Arrival date & time 07/31/14  0801 History   First MD Initiated Contact with Patient 07/31/14 0830     Chief Complaint  Patient presents with  . Eye Problem   (Consider location/radiation/quality/duration/timing/severity/associated sxs/prior Treatment) HPI        38 year old male presents complaining of cough and redness of his left eye. He has intermittently had a minimally productive cough for the past 2 weeks. His associated with rhinorrhea and occasional mild shortness of breath, he is not feeling short of breath at this time. This morning he woke up and his left eye was red, with a very red spot in the medial portion of the left eye. He is currently not having any symptoms and his eye include no itching, pain, or drainage. He had some slight itching of the eye last night. Fever, chills, chest pain, NVD, or eye pain  No past medical history on file. No past surgical history on file. No family history on file. History  Substance Use Topics  . Smoking status: Never Smoker   . Smokeless tobacco: Not on file  . Alcohol Use: Yes    Review of Systems  Constitutional: Negative for fever and chills.  HENT: Positive for congestion. Negative for postnasal drip and sore throat.   Eyes: Positive for redness and itching. Negative for discharge and visual disturbance.  Respiratory: Positive for cough and shortness of breath.   All other systems reviewed and are negative.   Allergies  Review of patient's allergies indicates no known allergies.  Home Medications   Prior to Admission medications   Medication Sig Start Date End Date Taking? Authorizing Provider  cetirizine (ZYRTEC) 10 MG tablet Take 10 mg by mouth daily.      Historical Provider, MD  pantoprazole (PROTONIX) 40 MG tablet Take 40 mg by mouth as needed.  08/20/13   Historical Provider, MD  PROAIR HFA 108 (90 BASE) MCG/ACT inhaler Inhale 2 puffs into the lungs every 6 (six) hours as needed.  08/16/13    Historical Provider, MD   BP 143/96 mmHg  Pulse 81  Temp(Src) 98.3 F (36.8 C) (Oral)  Resp 16  SpO2 100% Physical Exam  Constitutional: He is oriented to person, place, and time. He appears well-developed and well-nourished. No distress.  HENT:  Head: Normocephalic and atraumatic.  Right Ear: External ear normal.  Left Ear: External ear normal.  Nose: Nose normal. Right sinus exhibits no maxillary sinus tenderness and no frontal sinus tenderness. Left sinus exhibits no maxillary sinus tenderness and no frontal sinus tenderness.  Mouth/Throat: Uvula is midline, oropharynx is clear and moist and mucous membranes are normal. No oropharyngeal exudate or posterior oropharyngeal erythema.  Eyes: Conjunctivae and EOM are normal. Pupils are equal, round, and reactive to light. Right eye exhibits no discharge. Left eye exhibits no discharge.  Subconjunctival hemorrhage, 0.5 cm diameter, on the medial portion of the conjunctiva of the left eye  Neck: Normal range of motion. Neck supple.  Cardiovascular: Normal rate, regular rhythm and normal heart sounds.   Pulmonary/Chest: Effort normal and breath sounds normal. No respiratory distress.  Abdominal: Soft. He exhibits no distension. There is no splenomegaly. There is no tenderness.  Lymphadenopathy:    He has no cervical adenopathy.  Neurological: He is alert and oriented to person, place, and time. Coordination normal.  Skin: Skin is warm and dry. No petechiae and no rash noted. He is not diaphoretic.  Psychiatric: He has a normal mood and affect.  Judgment normal.  Nursing note and vitals reviewed.   ED Course  Procedures (including critical care time) Labs Review Labs Reviewed - No data to display  Imaging Review No results found.   MDM   1. Subconjunctival hemorrhage, left   2. Viral URI with cough    Watchful waiting on the subconjunctival hemorrhage. He has no other physical exam findings to indicate coagulopathy. Treat viral  URI with over-the-counter medications as needed. Return precautions discussed with the patient, follow-up when necessary       Graylon GoodZachary H Zoejane Gaulin, PA-C 07/31/14 [id number]

## 2014-10-24 ENCOUNTER — Other Ambulatory Visit: Payer: Self-pay

## 2014-10-31 ENCOUNTER — Encounter: Payer: Self-pay | Admitting: Family Medicine

## 2014-11-14 ENCOUNTER — Ambulatory Visit: Payer: Self-pay | Admitting: Family Medicine

## 2014-12-17 ENCOUNTER — Other Ambulatory Visit (INDEPENDENT_AMBULATORY_CARE_PROVIDER_SITE_OTHER): Payer: BLUE CROSS/BLUE SHIELD

## 2014-12-17 DIAGNOSIS — Z0000 Encounter for general adult medical examination without abnormal findings: Secondary | ICD-10-CM

## 2014-12-17 LAB — CBC WITH DIFFERENTIAL/PLATELET
BASOS PCT: 0.6 % (ref 0.0–3.0)
Basophils Absolute: 0 10*3/uL (ref 0.0–0.1)
EOS ABS: 0.3 10*3/uL (ref 0.0–0.7)
Eosinophils Relative: 4.1 % (ref 0.0–5.0)
HCT: 48.1 % (ref 39.0–52.0)
HEMOGLOBIN: 16.3 g/dL (ref 13.0–17.0)
Lymphocytes Relative: 33.6 % (ref 12.0–46.0)
Lymphs Abs: 2.3 10*3/uL (ref 0.7–4.0)
MCHC: 33.9 g/dL (ref 30.0–36.0)
MCV: 81.8 fl (ref 78.0–100.0)
MONO ABS: 0.3 10*3/uL (ref 0.1–1.0)
Monocytes Relative: 4.8 % (ref 3.0–12.0)
Neutro Abs: 4 10*3/uL (ref 1.4–7.7)
Neutrophils Relative %: 56.9 % (ref 43.0–77.0)
Platelets: 296 10*3/uL (ref 150.0–400.0)
RBC: 5.88 Mil/uL — ABNORMAL HIGH (ref 4.22–5.81)
RDW: 13.4 % (ref 11.5–15.5)
WBC: 6.9 10*3/uL (ref 4.0–10.5)

## 2014-12-17 LAB — BASIC METABOLIC PANEL
BUN: 12 mg/dL (ref 6–23)
CHLORIDE: 102 meq/L (ref 96–112)
CO2: 30 mEq/L (ref 19–32)
Calcium: 9.8 mg/dL (ref 8.4–10.5)
Creatinine, Ser: 0.94 mg/dL (ref 0.40–1.50)
GFR: 94.5 mL/min (ref >60.00)
GLUCOSE: 104 mg/dL — AB (ref 70–99)
Potassium: 4.9 mEq/L (ref 3.5–5.1)
Sodium: 139 mEq/L (ref 135–145)

## 2014-12-17 LAB — LIPID PANEL
CHOLESTEROL: 209 mg/dL — AB (ref 0–200)
HDL: 46.4 mg/dL (ref >39.00)
LDL Cholesterol: 137 mg/dL — ABNORMAL HIGH (ref 0–99)
NonHDL: 162.6
TRIGLYCERIDES: 130 mg/dL (ref 0.0–149.0)
Total CHOL/HDL Ratio: 5
VLDL: 26 mg/dL (ref 0.0–40.0)

## 2014-12-17 LAB — HEPATIC FUNCTION PANEL
ALBUMIN: 4.4 g/dL (ref 3.5–5.2)
ALK PHOS: 60 U/L (ref 39–117)
ALT: 28 U/L (ref 0–53)
AST: 23 U/L (ref 0–37)
Bilirubin, Direct: 0.1 mg/dL (ref 0.0–0.3)
Total Bilirubin: 0.6 mg/dL (ref 0.2–1.2)
Total Protein: 7.1 g/dL (ref 6.0–8.3)

## 2014-12-17 LAB — POCT URINALYSIS DIPSTICK
BILIRUBIN UA: NEGATIVE
Blood, UA: NEGATIVE
GLUCOSE UA: NEGATIVE
Ketones, UA: NEGATIVE
LEUKOCYTES UA: NEGATIVE
Nitrite, UA: NEGATIVE
PH UA: 6
PROTEIN UA: NEGATIVE
Spec Grav, UA: 1.015
Urobilinogen, UA: 0.2

## 2014-12-17 LAB — TSH: TSH: 1.64 u[IU]/mL (ref 0.35–4.50)

## 2014-12-24 ENCOUNTER — Ambulatory Visit (INDEPENDENT_AMBULATORY_CARE_PROVIDER_SITE_OTHER): Payer: BLUE CROSS/BLUE SHIELD | Admitting: Family Medicine

## 2014-12-24 ENCOUNTER — Encounter: Payer: Self-pay | Admitting: Family Medicine

## 2014-12-24 VITALS — BP 120/80 | Temp 98.1°F | Ht 67.5 in | Wt 186.0 lb

## 2014-12-24 DIAGNOSIS — Z0000 Encounter for general adult medical examination without abnormal findings: Secondary | ICD-10-CM

## 2014-12-24 NOTE — Patient Instructions (Signed)
Continue your diet and exercise program  Follow-up physical exam in one year sooner if any problems  Dr. Verne SpurrSteve Hunter

## 2014-12-24 NOTE — Progress Notes (Signed)
Pre visit review using our clinic review tool, if applicable. No additional management support is needed unless otherwise documented below in the visit note. 

## 2014-12-24 NOTE — Progress Notes (Signed)
   Subjective:    Patient ID: Martin Sanchez, male    DOB: 06-08-1976, 39 y.o.   MRN: [id number]  HPI Martin Sanchez is a 39 year old married male nonsmoker who comes in today for general physical examination  He's always been [facility] he has no chronic health problems.  He has allergic rhinitis for which he takes Zyrtec when necessary  He gets routine eye care, dental care, colonoscopy not until 49 no family history of colon cancer polyps. Review of systems negative except he does not exercise on a regular basis. Says he gets a lot of exercise at work  Vaccinations up-to-date tetanus booster 2007   Review of Systems  Constitutional: Negative.   HENT: Negative.   Eyes: Negative.   Respiratory: Negative.   Cardiovascular: Negative.   Gastrointestinal: Negative.   Endocrine: Negative.   Genitourinary: Negative.   Musculoskeletal: Negative.   Skin: Negative.   Allergic/Immunologic: Negative.   Neurological: Negative.   Hematological: Negative.   Psychiatric/Behavioral: Negative.        Objective:   Physical Exam  Constitutional: He is oriented to person, place, and time. He appears well-developed and well-nourished.  HENT:  Head: Normocephalic and atraumatic.  Right Ear: External ear normal.  Left Ear: External ear normal.  Nose: Nose normal.  Mouth/Throat: Oropharynx is clear and moist.  Eyes: Conjunctivae and EOM are normal. Pupils are equal, round, and reactive to light.  Neck: Normal range of motion. Neck supple. No JVD present. No tracheal deviation present. No thyromegaly present.  Cardiovascular: Normal rate, regular rhythm, normal heart sounds and intact distal pulses.  Exam reveals no gallop and no friction rub.   No murmur heard. Pulmonary/Chest: Effort normal and breath sounds normal. No stridor. No respiratory distress. He has no wheezes. He has no rales. He exhibits no tenderness.  Abdominal: Soft. Bowel sounds are normal. He exhibits no distension and no mass.  There is no tenderness. There is no rebound and no guarding.  Genitourinary: Penis normal. No penile tenderness.  Musculoskeletal: Normal range of motion. He exhibits no edema or tenderness.  Lymphadenopathy:    He has no cervical adenopathy.  Neurological: He is alert and oriented to person, place, and time. He has normal reflexes. No cranial nerve deficit. He exhibits normal muscle tone.  Skin: Skin is warm and dry. No rash noted. No erythema. No pallor.  Psychiatric: He has a normal mood and affect. His behavior is normal. Judgment and thought content normal.  Nursing note and vitals reviewed.         Assessment & Plan:  Health male  Family history of diabetes late adult onset,,,, father  Family history of thyroid disease mother

## 2015-08-21 ENCOUNTER — Encounter: Payer: Self-pay | Admitting: Family Medicine

## 2015-08-21 ENCOUNTER — Ambulatory Visit (INDEPENDENT_AMBULATORY_CARE_PROVIDER_SITE_OTHER): Payer: BLUE CROSS/BLUE SHIELD | Admitting: Family Medicine

## 2015-08-21 VITALS — BP 126/86 | HR 86 | Temp 98.0°F | Ht 67.5 in | Wt 198.4 lb

## 2015-08-21 DIAGNOSIS — K5900 Constipation, unspecified: Secondary | ICD-10-CM | POA: Diagnosis not present

## 2015-08-21 DIAGNOSIS — Z23 Encounter for immunization: Secondary | ICD-10-CM | POA: Diagnosis not present

## 2015-08-21 NOTE — Progress Notes (Signed)
  HPI:  Martin Sanchez is a very pleasant 39 yo M whom showed up quite late for his appointment today. We worked him. He reports intermittent issues with constipation and a FH of constipation in his father without any hx of GI cancers or inflamatory or immune related bowel disorders. He has noticed some worsening constipation over the last few months. He has occ hard stools, occ straining, occasionally will skip a day or tow and feel his abd is full when this happens. He admits to a poor diet and no exercise until recently. Since starting a healthier diet and exercise he is feeling better. Reports normal BMs the last several days. Denies: hematochezia, melena, abd pain, fevers, malaise, shoe string stools, floating stools, nvd, unexplained weight loss.  ROS: See pertinent positives and negatives per HPI.  No past medical history on file.  No past surgical history on file.  No family history on file.  Social History   Social History  . Marital Status: Married    Spouse Name: N/A  . Number of Children: N/A  . Years of Education: N/A   Social History Main Topics  . Smoking status: Never Smoker   . Smokeless tobacco: None  . Alcohol Use: Yes  . Drug Use: No  . Sexual Activity: Not Asked   Other Topics Concern  . None   Social History Narrative     Current outpatient prescriptions:  .  cetirizine (ZYRTEC) 10 MG tablet, Take 10 mg by mouth daily.  , Disp: , Rfl:   EXAM:  Filed Vitals:   08/21/15 1122  BP: 126/86  Pulse: 86  Temp: 98 F (36.7 C)    Body mass index is 30.6 kg/(m^2).  GENERAL: vitals reviewed and listed above, alert, oriented, appears well hydrated and in no acute distress  HEENT: atraumatic, conjunttiva clear, no obvious abnormalities on inspection of external nose and ears  NECK: no obvious masses on inspection  LUNGS: clear to auscultation bilaterally, no wheezes, rales or rhonchi, good air movement  CV: HRRR, no peripheral edema  ABD: BS+, soft,  nttp  MS: moves all extremities without noticeable abnormality  PSYCH: pleasant and cooperative, seems anxious in demeanor, no obvious depression   ASSESSMENT AND PLAN:  Discussed the following assessment and plan:  Constipation, unspecified constipation type  -likely mild ibs -c  -opted for trial fiber and continued lifestyle changes as this seems to be helping, prn mirilac -follow up with PCP in 1 month -Patient advised to return or notify a doctor immediately if symptoms worsen or persist or new concerns arise.  Patient Instructions  BEFORE YOU LEAVE: -schedule follow up with Dr. Tawanna Coolerodd in 3-4 weeks - sooner if worsening or new concerns.  Take a fiber supplement such as metameucil daily before breakfast.  Use mirilax once daily at the first signs of getting backed up.       Kriste BasqueKIM, Indigo Barbian R.

## 2015-08-21 NOTE — Progress Notes (Signed)
Pre visit review using our clinic review tool, if applicable. No additional management support is needed unless otherwise documented below in the visit note. 

## 2015-08-21 NOTE — Patient Instructions (Signed)
BEFORE YOU LEAVE: -schedule follow up with Dr. Tawanna Coolerodd in 3-4 weeks - sooner if worsening or new concerns.  Take a fiber supplement such as metameucil daily before breakfast.  Use mirilax once daily at the first signs of getting backed up.

## 2015-10-14 ENCOUNTER — Ambulatory Visit (INDEPENDENT_AMBULATORY_CARE_PROVIDER_SITE_OTHER): Payer: BLUE CROSS/BLUE SHIELD | Admitting: *Deleted

## 2015-10-14 DIAGNOSIS — Z23 Encounter for immunization: Secondary | ICD-10-CM

## 2015-11-28 ENCOUNTER — Encounter: Payer: Self-pay | Admitting: Family Medicine

## 2015-11-28 ENCOUNTER — Ambulatory Visit (INDEPENDENT_AMBULATORY_CARE_PROVIDER_SITE_OTHER): Payer: BLUE CROSS/BLUE SHIELD | Admitting: Family Medicine

## 2015-11-28 VITALS — BP 122/88 | HR 90 | Temp 98.0°F | Ht 67.5 in | Wt 193.2 lb

## 2015-11-28 DIAGNOSIS — J329 Chronic sinusitis, unspecified: Secondary | ICD-10-CM

## 2015-11-28 DIAGNOSIS — J310 Chronic rhinitis: Secondary | ICD-10-CM

## 2015-11-28 MED ORDER — DOXYCYCLINE HYCLATE 100 MG PO TABS: 100.0000 mg | ORAL_TABLET | Freq: Two times a day (BID) | ORAL | Status: DC

## 2015-11-28 NOTE — Progress Notes (Signed)
HPI:  URI: -started: 2 weeks ago -symptoms:nasal congestion, sore throat, cough - somewhat better but persistent PND, cough, tickle in throat, sinus pressure -denies:fever, SOB, NVD, tooth pain -has tried: claritin as has seasonal allergies - went to [facility] 1 week ago and told viral and not treatment except taking nyquil -sick contacts/travel/risks: no reported flu, strep or tick exposure - many folks at work with a cold -Hx of: allergies  He is happy to report that his constipation has completely resolved.  ROS: See pertinent positives and negatives per HPI.  No past medical history on file.  No past surgical history on file.  No family history on file.  Social History   Social History  . Marital Status: Married    Spouse Name: N/A  . Number of Children: N/A  . Years of Education: N/A   Social History Main Topics  . Smoking status: Never Smoker   . Smokeless tobacco: None  . Alcohol Use: Yes  . Drug Use: No  . Sexual Activity: Not Asked   Other Topics Concern  . None   Social History Narrative     Current outpatient prescriptions:  .  cetirizine (ZYRTEC) 10 MG tablet, Take 10 mg by mouth daily.  , Disp: , Rfl:  .  doxycycline (VIBRA-TABS) 100 MG tablet, Take 1 tablet (100 mg total) by mouth 2 (two) times daily., Disp: 20 tablet, Rfl: 0  EXAM:  Filed Vitals:   11/28/15 1456  BP: 122/88  Pulse: 90  Temp: 98 F (36.7 C)    Body mass index is 29.8 kg/(m^2).  GENERAL: vitals reviewed and listed above, alert, oriented, appears well hydrated and in no acute distress  HEENT: atraumatic, conjunttiva clear, no obvious abnormalities on inspection of external nose and ears, normal appearance of ear canals and TMs, clear nasal congestion, mild post oropharyngeal erythema with PND, no tonsillar edema or exudate, no sinus TTP  NECK: no obvious masses on inspection  LUNGS: clear to auscultation bilaterally, no wheezes, rales or rhonchi, good air movement  CV: HRRR,  no peripheral edema  MS: moves all extremities without noticeable abnormality  PSYCH: pleasant and cooperative, no obvious depression or anxiety  ASSESSMENT AND PLAN:  Discussed the following assessment and plan:  Rhinosinusitis  -given HPI and exam findings today, a serious infection or illness is unlikely. We discussed potential etiologies, with VURI versus allergic rhinitis being most likely, and advised a short course of a nasal decongestant along with increasing his allergy regimen to include an intranasal steroid for a few weeks. However, if he is not improving given the duration of his symptoms and his concern for a bacterial illness a delayed prescription for an antibiotic was provided after discussion of risks and proper use. We discussed treatment side effects, likely course, antibiotic misuse, transmission, and signs of developing a serious illness. -of course, we advised to return or notify a doctor immediately if symptoms worsen or persist or new concerns arise.    Patient Instructions  Afrin nasal spray twice daily for 5 days. Do not use this medication for longer than 5 days. This is available over-the-counter.  Flonase 2 sprays each nostril for 3 weeks.  Continue your Claritin.  I hope that you feel better soon!  If worsening or if you are not improving in about 5-7 days please start and complete the antibiotic.if you do not need to use the antibiotic, then please shred and discard the prescription.  Of course, if you are worsening significantly or  if your symptoms do not resolve with treatment, please follow up with your doctor.       Kriste BasqueKIM, Micca Matura R.

## 2015-11-28 NOTE — Patient Instructions (Signed)
Afrin nasal spray twice daily for 5 days. Do not use this medication for longer than 5 days. This is available over-the-counter.  Flonase 2 sprays each nostril for 3 weeks.  Continue your Claritin.  I hope that you feel better soon!  If worsening or if you are not improving in about 5-7 days please start and complete the antibiotic.if you do not need to use the antibiotic, then please shred and discard the prescription.  Of course, if you are worsening significantly or if your symptoms do not resolve with treatment, please follow up with your doctor.

## 2015-11-28 NOTE — Progress Notes (Signed)
Pre visit review using our clinic review tool, if applicable. No additional management support is needed unless otherwise documented below in the visit note. 

## 2016-01-08 ENCOUNTER — Encounter: Payer: Self-pay | Admitting: Adult Health

## 2016-01-08 ENCOUNTER — Ambulatory Visit (INDEPENDENT_AMBULATORY_CARE_PROVIDER_SITE_OTHER): Payer: BLUE CROSS/BLUE SHIELD | Admitting: Adult Health

## 2016-01-08 VITALS — BP 140/82 | Temp 98.2°F | Wt 191.2 lb

## 2016-01-08 DIAGNOSIS — B372 Candidiasis of skin and nail: Secondary | ICD-10-CM

## 2016-01-08 DIAGNOSIS — A609 Anogenital herpesviral infection, unspecified: Secondary | ICD-10-CM

## 2016-01-08 DIAGNOSIS — A6002 Herpesviral infection of other male genital organs: Secondary | ICD-10-CM

## 2016-01-08 MED ORDER — VALACYCLOVIR HCL 1 G PO TABS: 1000.0000 mg | ORAL_TABLET | Freq: Two times a day (BID) | ORAL | Status: DC

## 2016-01-08 MED ORDER — CLOTRIMAZOLE-BETAMETHASONE 1-0.05 % EX CREA: 1.0000 "application " | TOPICAL_CREAM | Freq: Two times a day (BID) | CUTANEOUS | Status: DC

## 2016-01-08 NOTE — Progress Notes (Signed)
   Subjective:    Patient ID: Martin Sanchez, male    DOB: [birth year], 40 y.o.   MRN: [id number]  HPI  40 year old male patient of Dr. Tawanna Coolerodd who presents to the office today for a "rash around my penis". He reports that over the last 3-4 days he has noticed a white film, has had pain and burning on the skin and has noticed bumps on his foreskin. He is not circumcised. Does have a history of herpes per patient.   Has had this happen in the past,around 2011.   Denies any fever, discharge from the tip of the penis, or new sexual partners.   Review of Systems  Constitutional: Negative.   Genitourinary: Positive for penile pain. Negative for dysuria, discharge, penile swelling, scrotal swelling and testicular pain.  All other systems reviewed and are negative.  No past medical history on file.  Social History   Social History  . Marital Status: Married    Spouse Name: N/A  . Number of Children: N/A  . Years of Education: N/A   Occupational History  . Not on file.   Social History Main Topics  . Smoking status: Never Smoker   . Smokeless tobacco: Not on file  . Alcohol Use: Yes  . Drug Use: No  . Sexual Activity: Not on file   Other Topics Concern  . Not on file   Social History Narrative    No past surgical history on file.  No family history on file.  No Known Allergies  Current Outpatient Prescriptions on File Prior to Visit  Medication Sig Dispense Refill  . cetirizine (ZYRTEC) 10 MG tablet Take 10 mg by mouth daily.       No current facility-administered medications on file prior to visit.    BP 140/82 mmHg  Temp(Src) 98.2 F (36.8 C) (Oral)  Wt 191 lb 3.2 oz (86.728 kg)       Objective:   Physical Exam  Constitutional: He is oriented to person, place, and time. He appears well-developed and well-nourished. No distress.  Genitourinary:  No white film noted. Does have redness and irritation along the head of the penis. Two small raised areas on the  underside of the penis.   Neurological: He is alert and oriented to person, place, and time.  Skin: Skin is warm and dry. No rash noted. He is not diaphoretic. No erythema. No pallor.  Psychiatric: He has a normal mood and affect. His behavior is normal. Judgment normal.  Nursing note and vitals reviewed.      Assessment & Plan:  1. Herpes genitalis in men - Will treat as possible herpes infection as well as yeast infection. Advised to keep foreskin clean and dry. - valACYclovir (VALTREX) 1000 MG tablet; Take 1 tablet (1,000 mg total) by mouth 2 (two) times daily.  Dispense: 20 tablet; Refill: 0  2. Skin yeast infection - clotrimazole-betamethasone (LOTRISONE) cream; Apply 1 application topically 2 (two) times daily.  Dispense: 45 g; Refill: 2 - Apply twice daily  - follow up if no improvement.   Shirline Freesory Kateleen Encarnacion, NP

## 2016-02-14 LAB — HEMOGLOBIN A1C: HEMOGLOBIN A1C: 10.9

## 2016-02-14 LAB — LIPID PANEL
Cholesterol: 232 mg/dL — AB (ref 0–200)
HDL: 43 mg/dL (ref 35–70)
LDL CALC: 133 mg/dL
TRIGLYCERIDES: 278 mg/dL — AB (ref 40–160)

## 2016-02-16 LAB — HM DIABETES EYE EXAM

## 2016-03-08 ENCOUNTER — Encounter: Payer: Self-pay | Admitting: Family Medicine

## 2016-03-08 ENCOUNTER — Ambulatory Visit (INDEPENDENT_AMBULATORY_CARE_PROVIDER_SITE_OTHER): Payer: BLUE CROSS/BLUE SHIELD | Admitting: Family Medicine

## 2016-03-08 VITALS — BP 132/90 | HR 99 | Temp 97.7°F | Ht 67.5 in | Wt 185.4 lb

## 2016-03-08 DIAGNOSIS — E785 Hyperlipidemia, unspecified: Secondary | ICD-10-CM | POA: Diagnosis not present

## 2016-03-08 DIAGNOSIS — IMO0001 Reserved for inherently not codable concepts without codable children: Secondary | ICD-10-CM

## 2016-03-08 DIAGNOSIS — R030 Elevated blood-pressure reading, without diagnosis of hypertension: Secondary | ICD-10-CM

## 2016-03-08 DIAGNOSIS — E119 Type 2 diabetes mellitus without complications: Secondary | ICD-10-CM | POA: Diagnosis not present

## 2016-03-08 DIAGNOSIS — R748 Abnormal levels of other serum enzymes: Secondary | ICD-10-CM | POA: Diagnosis not present

## 2016-03-08 HISTORY — DX: Hyperlipidemia, unspecified: E78.5

## 2016-03-08 HISTORY — DX: Type 2 diabetes mellitus without complications: E11.9

## 2016-03-08 MED ORDER — SITAGLIPTIN PHOS-METFORMIN HCL 50-500 MG PO TABS: 1.0000 | ORAL_TABLET | Freq: Two times a day (BID) | ORAL | Status: DC

## 2016-03-08 NOTE — Progress Notes (Addendum)
HPI:  Martin Sanchez is a 40 yo pt of Dr. Tawanna Coolerodd, here for an acute visit for a new diagnosis of diabetes.  He reports he was traveling in June and felt a little tired and saw a doctor in [place]. He was diagnosed with diabetes with a hemoglobin A1c around 10, cholesterol and abnormal liver function test. A healthy diet and regular exercise were advised, along with metformin and vildagliptin. He reports he has made dramatic changes in his diet, is eating less sugar, and is walking 3 miles per day. He is taking his medications daily. He reports his fasting blood sugars are 96-133 with an average under 130; and his postprandial blood sugars are 114-163 with an average under 150. He had a diabetic eye exam without retinopathy and an EKG in [place] as well. He prefers to treat his cholesterol and blood pressure issues with lifestyle changes. He has just started these last few weeks and has made dramatic progress. He denies chest pain, shortness of breath, dyspnea on exertion, swelling or vision changes and overall is feeling much better. He would like to transfer his care to me, as his normal PCP is rarely here.   ROS: See pertinent positives and negatives per HPI.  No past medical history on file.  No past surgical history on file.  No family history on file.  Social History   Social History  . Marital Status: Married    Spouse Name: N/A  . Number of Children: N/A  . Years of Education: N/A   Social History Main Topics  . Smoking status: Never Smoker   . Smokeless tobacco: None  . Alcohol Use: Yes  . Drug Use: No  . Sexual Activity: Not Asked   Other Topics Concern  . None   Social History Narrative     Current outpatient prescriptions:  .  cetirizine (ZYRTEC) 10 MG tablet, Take 10 mg by mouth daily.  , Disp: , Rfl:  .  clotrimazole-betamethasone (LOTRISONE) cream, Apply 1 application topically 2 (two) times daily., Disp: 45 g, Rfl: 2 .  sitaGLIPtin-metformin (JANUMET) 50-500 MG tablet,  Take 1 tablet by mouth 2 (two) times daily with a meal., Disp: 60 tablet, Rfl: 3  EXAM:  Filed Vitals:   03/08/16 1116  BP: 132/90  Pulse: 99  Temp: 97.7 F (36.5 C)    Body mass index is 28.59 kg/(m^2).  GENERAL: vitals reviewed and listed above, alert, oriented, appears well hydrated and in no acute distress  HEENT: atraumatic, conjunttiva clear, no obvious abnormalities on inspection of external nose and ears  NECK: no obvious masses on inspection  LUNGS: clear to auscultation bilaterally, no wheezes, rales or rhonchi, good air movement  CV: HRRR, no peripheral edema  MS: moves all extremities without noticeable abnormality  PSYCH: pleasant and cooperative, no obvious depression or anxiety  ASSESSMENT AND PLAN:  Discussed the following assessment and plan:  Type 2 diabetes mellitus without complication, without long-term current use of insulin (HCC)  Hyperlipemia  Elevated liver enzymes  Elevated blood pressure  -he was schedule for a quick visit - did our best to provide some brief education on implication, eval, treatment of his various new issues -congratulated him on dramatic lifestyle changes -advised to quit any alcohol use, eat healthy mediterranean diet, cont regular exercise -he prefers to start with lifestyle changes for tx hld and htn, but agrees to cont diabetes medications -changed to janumet -he wants to transfer care to me, advised to schedule NPV in 2 months  and will recheck labs then -labs/tests reviewed and scanned -Patient advised to return or notify a doctor immediately if symptoms worsen or persist or new concerns arise.  Patient Instructions  BEFORE YOU LEAVE: -follow up: New Patient visit in 2-3 months with Dr. Selena Batten - come fasting as we will plan to check labs that day. Drink plenty of water.  Continue the medications as prescribed.  We may need to start a blood pressure and cholesterol medication at your next visit.  We recommend the  following healthy lifestyle: 1) Small portions - eat off of salad plate instead of dinner plate 2) Eat a healthy clean diet with avoidance of (less then 1 serving per week) processed foods, sweetened drinks, white starches (white bread, white rice, pizza, white pasta), red meat, fast foods and sweets and consisting of: * 5-9 servings per day of fresh or frozen fruits and vegetables (not corn or potatoes, not dried or canned) *nuts and seeds, beans *olives and olive oil *small portions of lean meats such as fish and white chicken  *small portions of whole grains 3)Get at least 150 minutes of sweaty aerobic exercise per week 4)reduce stress - counseling, meditation, relaxation to balance other aspects of your life        Kriste Basque R., DO   Addendum: lab results from 02/14/16 Hgba1c: 10.90 LFTs normal except AST 39; ggt normal, all other including fractionated bili normal Cr 0.84 Lipids: T 232, HDL 43, LDL 133, Trig 278

## 2016-03-08 NOTE — Patient Instructions (Addendum)
BEFORE YOU LEAVE: -follow up: New Patient visit in 2-3 months with Dr. Selena BattenKim - come fasting as we will plan to check labs that day. Drink plenty of water.  Continue the medications as prescribed.  We may need to start a blood pressure and cholesterol medication at your next visit.  We recommend the following healthy lifestyle: 1) Small portions - eat off of salad plate instead of dinner plate 2) Eat a healthy clean diet with avoidance of (less then 1 serving per week) processed foods, sweetened drinks, white starches (white bread, white rice, pizza, white pasta), red meat, fast foods and sweets and consisting of: * 5-9 servings per day of fresh or frozen fruits and vegetables (not corn or potatoes, not dried or canned) *nuts and seeds, beans *olives and olive oil *small portions of lean meats such as fish and white chicken  *small portions of whole grains 3)Get at least 150 minutes of sweaty aerobic exercise per week 4)reduce stress - counseling, meditation, relaxation to balance other aspects of your life

## 2016-03-08 NOTE — Progress Notes (Signed)
Pre visit review using our clinic review tool, if applicable. No additional management support is needed unless otherwise documented below in the visit note. 

## 2016-03-22 ENCOUNTER — Telehealth: Payer: Self-pay | Admitting: Family Medicine

## 2016-03-22 MED ORDER — SITAGLIPTIN PHOS-METFORMIN HCL 50-500 MG PO TABS: 1.0000 | ORAL_TABLET | Freq: Two times a day (BID) | ORAL | 0 refills | Status: DC

## 2016-03-22 NOTE — Telephone Encounter (Signed)
Rx done. 

## 2016-03-22 NOTE — Telephone Encounter (Signed)
Pt would like to have his Rx Janumet transferred to [facility] [phone].

## 2016-06-10 NOTE — Progress Notes (Signed)
HPI:  Martin Sanchez is here to establish care. He used to see Dr. Tawanna Coolerodd, but wanted to see a provider who is here on a regular basis. Last PCP and physical:  Has the following chronic problems that require follow up and concerns today:  DM: -complications: none known -dx in 2017 in [place] with neg optho exam and EKG at the time -initial hgba1c 10.9 (6/17) --> 6.1! -home BS: 97-104 -meds: janumet -he really wanted to do lifestyle changes before further meds added -lifestyle: walking 2.5 mile walk per day, goes to the gym 3-4 times per week; vegetarian, has cut down on bread and rice intake, does whole grains more instead of white -no aerobic exercise -has lost > 10 lbs  HLD: -he wanted to do lifestyle changes - see above -he still is against taking a cholesterol medication -denies hx statin intolerance  Elevated Blood : -he did not want to do blood pressure medication -denies: CP, SOB, DOE, HA, vision changes  Elevated liver enzymes: -in [place] -now has stopped all alcohol use -no nausea, vomiting, abd pain  ROS negative for unless reported above: fevers, unintentional weight loss, hearing or vision loss, chest pain, palpitations, struggling to breath, hemoptysis, melena, hematochezia, hematuria, falls, loc, si, thoughts of self harm  Past Medical History:  Diagnosis Date  . Hyperlipemia 03/08/2016  . Type 2 diabetes mellitus without complication, without long-term current use of insulin (HCC) 03/08/2016    No past surgical history on file.  No family history on file.  Social History   Social History  . Marital status: Married    Spouse name: N/A  . Number of children: N/A  . Years of education: N/A   Social History Main Topics  . Smoking status: Never Smoker  . Smokeless tobacco: None  . Alcohol use Yes  . Drug use: No  . Sexual activity: Not Asked   Other Topics Concern  . None   Social History Narrative   Work or School: Art gallery managerengineer - Therapist, sportsindustrial      Home  Situation: lives with wife and son      Spiritual Beliefs: Hindu      Lifestyle: in 2017 dx with diabetes, working on improving diet and exercises        Current Outpatient Prescriptions:  .  cetirizine (ZYRTEC) 10 MG tablet, Take 10 mg by mouth daily as needed. , Disp: , Rfl:  .  losartan (COZAAR) 50 MG tablet, Take 1 tablet (50 mg total) by mouth daily., Disp: 90 tablet, Rfl: 3 .  sitaGLIPtin-metformin (JANUMET) 50-500 MG tablet, Take 1 tablet by mouth 2 (two) times daily with a meal., Disp: 180 tablet, Rfl: 0  EXAM:  Vitals:   06/11/16 0852  BP: (!) 126/92  Pulse: 80  Temp: 97.8 F (36.6 C)    Body mass index is 28.5 kg/m.  GENERAL: vitals reviewed and listed above, alert, oriented, appears well hydrated and in no acute distress  HEENT: atraumatic, conjunttiva clear, no obvious abnormalities on inspection of external nose and ears  NECK: no obvious masses on inspection  LUNGS: clear to auscultation bilaterally, no wheezes, rales or rhonchi, good air movement  CV: HRRR, no peripheral edema  MS: moves all extremities without noticeable abnormality  PSYCH: pleasant and cooperative, no obvious depression or anxiety  ASSESSMENT AND PLAN:  Discussed the following assessment and plan: -More than 50% of over 40 minutes spent in total in caring for this patient was spent face-to-face with the patient, counseling and/or coordinating  care.    Type 2 diabetes mellitus without complication, without long-term current use of insulin (HCC)  Hyperlipidemia, unspecified hyperlipidemia type - Plan: Basic metabolic panel  Essential hypertension - Plan: Lipid Panel, losartan (COZAAR) 50 MG tablet  BMI 28.0-28.9,adult  Elevated liver enzymes - Plan: Hepatic Function Panel -We reviewed the PMH, PSH, FH, SH, Meds and Allergies. -We provided refills for any medications we will prescribe as needed. -We addressed current concerns per orders and patient instructions. -Lifestyle  recommendations - discussed at length  and advised increased aerobic exercise, continuing to tweak the diet -he would like to eventually be able to reduce or stop medications - we will monitor and do so cautiously as he continues to build on a healthy lifestyle -Labs per orders -Advised statin and discussed risk/benefits -advised adding losartan and risks discused -Out of date on many health maintenance measures: Vaccines offered today, foot exam done, I exam done in [place] per his report -CPE in 3 months  -Patient advised to return or notify a doctor immediately if symptoms worsen or persist or new concerns arise.  Patient Instructions  BEFORE YOU LEAVE: -follow up: 1 month -Tdap  -labs  Start losartan to protect the kidneys and for the blood pressure. Take once daily in the morning  We have ordered labs or studies at this visit. It can take up to 1-2 weeks for results and processing. IF results require follow up or explanation, we will call you with instructions. Clinically stable results will be released to your [facility]. If you have not heard from [place] or cannot find your results in [facility] in 2 weeks please contact our office at [phone].  If you are not yet signed up for [facility], please consider signing up.   We recommend the following healthy lifestyle for LIFE: 1) Small portions.   Tip: eat off of a salad plate instead of a dinner plate.  Tip: It is ok to feel hungry after a meal - that likely means you ate an appropriate portion.  Tip: if you need more or a snack choose fruits, veggies and/or a handful of nuts or seeds.  2) Eat a healthy clean diet.  * Tip: Avoid (less then 1 serving per week): processed foods, sweets, sweetened drinks, white starches (rice, flour, bread, potatoes, pasta, etc), fast foods, butter  *Tip: CHOOSE instead   * 5-9 servings per day of fresh or frozen fruits and vegetables (but not corn, potatoes, bananas, canned or dried fruit)   *nuts and seeds,  beans   *olives and olive oil   *small portions of whole grains  3)Get at least 150 minutes of sweaty aerobic exercise per week. Can look at the American Heart Association website for target heart rate.  4)Reduce stress - consider counseling, meditation and relaxation to balance other aspects of your life.          Kriste Basque R.

## 2016-06-11 ENCOUNTER — Encounter: Payer: Self-pay | Admitting: Family Medicine

## 2016-06-11 ENCOUNTER — Ambulatory Visit (INDEPENDENT_AMBULATORY_CARE_PROVIDER_SITE_OTHER): Payer: BLUE CROSS/BLUE SHIELD | Admitting: Family Medicine

## 2016-06-11 VITALS — BP 126/92 | HR 80 | Temp 97.8°F | Ht 67.5 in | Wt 184.7 lb

## 2016-06-11 DIAGNOSIS — E119 Type 2 diabetes mellitus without complications: Secondary | ICD-10-CM | POA: Diagnosis not present

## 2016-06-11 DIAGNOSIS — E785 Hyperlipidemia, unspecified: Secondary | ICD-10-CM | POA: Diagnosis not present

## 2016-06-11 DIAGNOSIS — I10 Essential (primary) hypertension: Secondary | ICD-10-CM | POA: Diagnosis not present

## 2016-06-11 DIAGNOSIS — I152 Hypertension secondary to endocrine disorders: Secondary | ICD-10-CM | POA: Insufficient documentation

## 2016-06-11 DIAGNOSIS — Z23 Encounter for immunization: Secondary | ICD-10-CM

## 2016-06-11 DIAGNOSIS — R748 Abnormal levels of other serum enzymes: Secondary | ICD-10-CM

## 2016-06-11 DIAGNOSIS — Z6828 Body mass index (BMI) 28.0-28.9, adult: Secondary | ICD-10-CM

## 2016-06-11 DIAGNOSIS — E1159 Type 2 diabetes mellitus with other circulatory complications: Secondary | ICD-10-CM | POA: Insufficient documentation

## 2016-06-11 HISTORY — DX: Body mass index (BMI) 28.0-28.9, adult: Z68.28

## 2016-06-11 HISTORY — DX: Essential (primary) hypertension: I10

## 2016-06-11 LAB — BASIC METABOLIC PANEL
BUN: 11 mg/dL (ref 6–23)
CHLORIDE: 99 meq/L (ref 96–112)
CO2: 33 mEq/L — ABNORMAL HIGH (ref 19–32)
Calcium: 10 mg/dL (ref 8.4–10.5)
Creatinine, Ser: 0.9 mg/dL (ref 0.40–1.50)
GFR: 98.63 mL/min (ref >60.00)
Glucose, Bld: 100 mg/dL — ABNORMAL HIGH (ref 70–99)
POTASSIUM: 4.8 meq/L (ref 3.5–5.1)
Sodium: 139 mEq/L (ref 135–145)

## 2016-06-11 LAB — LIPID PANEL
CHOL/HDL RATIO: 5
CHOLESTEROL: 201 mg/dL — AB (ref 0–200)
HDL: 43.1 mg/dL (ref >39.00)
LDL Cholesterol: 121 mg/dL — ABNORMAL HIGH (ref 0–99)
NonHDL: 158.15
TRIGLYCERIDES: 185 mg/dL — AB (ref 0.0–149.0)
VLDL: 37 mg/dL (ref 0.0–40.0)

## 2016-06-11 LAB — HEPATIC FUNCTION PANEL
ALT: 21 U/L (ref 0–53)
AST: 19 U/L (ref 0–37)
Albumin: 4.8 g/dL (ref 3.5–5.2)
Alkaline Phosphatase: 56 U/L (ref 39–117)
Bilirubin, Direct: 0.1 mg/dL (ref 0.0–0.3)
TOTAL PROTEIN: 7.5 g/dL (ref 6.0–8.3)
Total Bilirubin: 0.7 mg/dL (ref 0.2–1.2)

## 2016-06-11 LAB — POCT GLYCOSYLATED HEMOGLOBIN (HGB A1C): HEMOGLOBIN A1C: 6.1

## 2016-06-11 MED ORDER — LOSARTAN POTASSIUM 50 MG PO TABS: 50.0000 mg | ORAL_TABLET | Freq: Every day | ORAL | 3 refills | Status: DC

## 2016-06-11 NOTE — Addendum Note (Signed)
Addended by: Johnella MoloneyFUNDERBURK, Vern Guerette A on: 06/11/2016 10:01 AM   Modules accepted: Orders

## 2016-06-11 NOTE — Patient Instructions (Addendum)
BEFORE YOU LEAVE: -follow up: 1 month -Tdap  -labs  Start losartan to protect the kidneys and for the blood pressure. Take once daily in the morning  We have ordered labs or studies at this visit. It can take up to 1-2 weeks for results and processing. IF results require follow up or explanation, we will call you with instructions. Clinically stable results will be released to your [facility]. If you have not heard from us or cannot find your results in [facility] in 2 weeks please contact our office at [phone].  If you are not yet signed up for [facility], please consider signing up.   We recommend the following healthy lifestyle for LIFE: 1) Small portions.   Tip: eat off of a salad plate instead of a dinner plate.  Tip: It is ok to feel hungry after a meal - that likely means you ate an appropriate portion.  Tip: if you need more or a snack choose fruits, veggies and/or a handful of nuts or seeds.  2) Eat a healthy clean diet.  * Tip: Avoid (less then 1 serving per week): processed foods, sweets, sweetened drinks, white starches (rice, flour, bread, potatoes, pasta, etc), fast foods, butter  *Tip: CHOOSE instead   * 5-9 servings per day of fresh or frozen fruits and vegetables (but not corn, potatoes, bananas, canned or dried fruit)   *nuts and seeds, beans   *olives and olive oil   *small portions of whole grains  3)Get at least 150 minutes of sweaty aerobic exercise per week. Can look at the American Heart Association website for target heart rate.  4)Reduce stress - consider counseling, meditation and relaxation to balance other aspects of your life.

## 2016-06-11 NOTE — Progress Notes (Signed)
Pre visit review using our clinic review tool, if applicable. No additional management support is needed unless otherwise documented below in the visit note. 

## 2016-06-15 MED ORDER — ROSUVASTATIN CALCIUM 10 MG PO TABS: 10.0000 mg | ORAL_TABLET | Freq: Every day | ORAL | 3 refills | Status: DC

## 2016-06-15 NOTE — Addendum Note (Signed)
Addended by: Johnella MoloneyFUNDERBURK, JO A on: 06/15/2016 11:52 AM   Modules accepted: Orders

## 2016-07-07 ENCOUNTER — Other Ambulatory Visit: Payer: Self-pay | Admitting: Family Medicine

## 2016-07-07 ENCOUNTER — Telehealth: Payer: Self-pay | Admitting: Family Medicine

## 2016-07-07 DIAGNOSIS — I10 Essential (primary) hypertension: Secondary | ICD-10-CM

## 2016-07-07 DIAGNOSIS — E785 Hyperlipidemia, unspecified: Secondary | ICD-10-CM

## 2016-07-07 DIAGNOSIS — E119 Type 2 diabetes mellitus without complications: Secondary | ICD-10-CM

## 2016-07-07 NOTE — Telephone Encounter (Signed)
Pt would like to have his CPE labs prior to his visit in Jan.  Pt states he would like to discuss with you at visit. Is it ok to schedule?

## 2016-07-07 NOTE — Telephone Encounter (Signed)
Ok to do labs a few days prior to his appt. I placed orders.

## 2016-07-08 NOTE — Telephone Encounter (Signed)
Left message to call back  

## 2016-07-08 NOTE — Telephone Encounter (Signed)
Pt has been scheduled.  °

## 2016-07-22 ENCOUNTER — Ambulatory Visit: Payer: BLUE CROSS/BLUE SHIELD | Admitting: Family Medicine

## 2016-07-22 ENCOUNTER — Other Ambulatory Visit: Payer: Self-pay | Admitting: Family Medicine

## 2016-07-22 MED ORDER — SITAGLIPTIN PHOS-METFORMIN HCL 50-500 MG PO TABS: 1.0000 | ORAL_TABLET | Freq: Two times a day (BID) | ORAL | 1 refills | Status: DC

## 2016-07-23 ENCOUNTER — Telehealth: Payer: Self-pay | Admitting: Family Medicine

## 2016-07-23 MED ORDER — SITAGLIPTIN PHOS-METFORMIN HCL 50-500 MG PO TABS: 1.0000 | ORAL_TABLET | Freq: Two times a day (BID) | ORAL | 1 refills | Status: DC

## 2016-07-23 NOTE — Telephone Encounter (Signed)
Rx done. 

## 2016-07-23 NOTE — Telephone Encounter (Signed)
Patient wife came in and states that the patient would like for his  RX sitaGLIPtin-metformin (JANUMET) 50-500 MG tablet to be sent to [facility] of [facility].

## 2016-08-19 ENCOUNTER — Ambulatory Visit (INDEPENDENT_AMBULATORY_CARE_PROVIDER_SITE_OTHER): Payer: BLUE CROSS/BLUE SHIELD | Admitting: Family Medicine

## 2016-08-19 ENCOUNTER — Encounter: Payer: Self-pay | Admitting: Family Medicine

## 2016-08-19 VITALS — BP 126/90 | HR 72 | Temp 97.4°F | Ht 67.5 in | Wt 187.4 lb

## 2016-08-19 DIAGNOSIS — M542 Cervicalgia: Secondary | ICD-10-CM

## 2016-08-19 DIAGNOSIS — E119 Type 2 diabetes mellitus without complications: Secondary | ICD-10-CM

## 2016-08-19 DIAGNOSIS — I10 Essential (primary) hypertension: Secondary | ICD-10-CM

## 2016-08-19 DIAGNOSIS — E785 Hyperlipidemia, unspecified: Secondary | ICD-10-CM | POA: Diagnosis not present

## 2016-08-19 NOTE — Progress Notes (Signed)
Pre visit review using our clinic review tool, if applicable. No additional management support is needed unless otherwise documented below in the visit note. 

## 2016-08-19 NOTE — Patient Instructions (Addendum)
BEFORE YOU LEAVE: -follow up: in January as planned/scheduled  Monitor the soreness in the neck.  Continue your healthy diet and regular exercise. Consider adding a core workout session 1-2 times per week.  Happy new year!

## 2016-08-19 NOTE — Progress Notes (Signed)
HPI:  Martin Sanchez is a very pleasant 40 year old with a recent diagnosis of diabetes, hypertension and hyperlipidemia here for an acute visit for a sore neck. He noticed a sore noted in the right anterior neck that started a few days after going to [place] a few days ago. This has gradually improved. He has not really been sick-denies sinus congestion, cough or sore throat. He has not had any dental issues, and reports he sees his dentist once a year.  He continues to exercise and eat healthy. He does not want to take any medicines and is only taking the Janumet. He has reduced this on his own, but reports his blood sugars at home have been in the 90s to 100. He is not taking the blood pressure or cholesterol medicine and prefers to not take these unless blood pressure and cholesterol and in high at his physical in January.  ROS: See pertinent positives and negatives per HPI.  Past Medical History:  Diagnosis Date  . BMI 28.0-28.9,adult 06/11/2016  . Essential hypertension 06/11/2016  . Hyperlipemia 03/08/2016  . Type 2 diabetes mellitus without complication, without long-term current use of insulin (HCC) 03/08/2016    No past surgical history on file.  No family history on file.  Social History   Social History  . Marital status: Married    Spouse name: N/A  . Number of children: N/A  . Years of education: N/A   Social History Main Topics  . Smoking status: Never Smoker  . Smokeless tobacco: None  . Alcohol use Yes  . Drug use: No  . Sexual activity: Not Asked   Other Topics Concern  . None   Social History Narrative   Work or School: Art gallery managerengineer - Therapist, sportsindustrial      Home Situation: lives with wife and son      Spiritual Beliefs: Hindu      Lifestyle: in 2017 dx with diabetes, working on improving diet and exercises        Current Outpatient Prescriptions:  .  cetirizine (ZYRTEC) 10 MG tablet, Take 10 mg by mouth daily as needed. , Disp: , Rfl:  .  losartan (COZAAR)  50 MG tablet, Take 1 tablet (50 mg total) by mouth daily., Disp: 90 tablet, Rfl: 3 .  rosuvastatin (CRESTOR) 10 MG tablet, Take 1 tablet (10 mg total) by mouth daily., Disp: 30 tablet, Rfl: 3 .  sitaGLIPtin-metformin (JANUMET) 50-500 MG tablet, Take 1 tablet by mouth 2 (two) times daily with a meal., Disp: 180 tablet, Rfl: 1  EXAM:  Vitals:   08/19/16 1013  BP: 126/90  Pulse: 72  Temp: 97.4 F (36.3 C)    Body mass index is 28.92 kg/m.  GENERAL: vitals reviewed and listed above, alert, oriented, appears well hydrated and in no acute distress  HEENT: atraumatic, conjunttiva clear, no obvious abnormalities on inspection of external nose and ears, normal appearance of ear canals and TMs, clear nasal congestion, mild post oropharyngeal erythema with PND, no tonsillar edema or exudate, no sinus TTP  NECK: no obvious masses on inspection, no sig lymphadenopathy - few very small shotty ant nodes - mildly tender R  LUNGS: clear to auscultation bilaterally, no wheezes, rales or rhonchi, good air movement  CV: HRRR, no peripheral edema  MS: moves all extremities without noticeable abnormality  PSYCH: pleasant and cooperative, no obvious depression or anxiety  ASSESSMENT AND PLAN:  Discussed the following assessment and plan:  Neck pain -query mild lymph node tenderness from likely mild  VURI -resolving - advise ro monitor and recheck at physical  Essential hypertension Type 2 diabetes mellitus without complication, without long-term current use of insulin (HCC) Hyperlipidemia, unspecified hyperlipidemia type -lifestyle recs/contratulated on changes! -advised should start medication if issues remain at follow up - he agrees  -Patient advised to return or notify a doctor immediately if symptoms worsen or persist or new concerns arise.  Patient Instructions  BEFORE YOU LEAVE: -follow up: in January as planned/scheduled  Monitor the soreness in the neck.  Continue your healthy  diet and regular exercise. Consider adding a core workout session 1-2 times per week.  Happy new year!    Kriste BasqueKIM, HANNAH R., DO

## 2016-09-09 ENCOUNTER — Other Ambulatory Visit: Payer: BLUE CROSS/BLUE SHIELD

## 2016-09-15 NOTE — Progress Notes (Signed)
HPI:  Here for CPE:  -Concerns and/or follow up today:  PMH significant for HTN, DM, HLD, elevated BMI, CTS. He has been reluctant to take medications and has been making dramatic lifestyle changes. Reports no taking losartan or statin. Home BS 80-105. Exercising 45 minutes 3-5 days per week. Trying to eat healthy - has made significant changes. Less sugar, less simple starches. Has dry mouth sometimes, takes zyrtec for allergies. O/w no complaints.  Due for pneumococcal vaccine, HIV screening, labs. Does not want to get shot and labs same day - hates needles.  -sexual activity: yes, male partner, no new partners  -wants STI testing, Hep C screening (if born 27-1966): no  -Alcohol, Tobacco, drug use: see social history  Review of Systems - no fevers, unintentional weight loss, vision loss, hearing loss, chest pain, sob, hemoptysis, melena, hematochezia, hematuria, genital discharge, changing or concerning skin lesions, bleeding, bruising, loc, thoughts of self harm or SI  Past Medical History:  Diagnosis Date  . BMI 28.0-28.9,adult 06/11/2016  . Essential hypertension 06/11/2016  . Hyperlipemia 03/08/2016  . Type 2 diabetes mellitus without complication, without long-term current use of insulin ([place]) 03/08/2016    No past surgical history on file.  No family history on file.  Social History   Social History  . Marital status: Married    Spouse name: N/A  . Number of children: N/A  . Years of education: N/A   Social History Main Topics  . Smoking status: Never Smoker  . Smokeless tobacco: Never Used  . Alcohol use Yes  . Drug use: No  . Sexual activity: Not Asked   Other Topics Concern  . None   Social History Narrative   Work or School: Chief Financial Officer - Statistician Situation: lives with wife and son      Spiritual Beliefs: Hindu      Lifestyle: in 2017 dx with diabetes, working on improving diet and exercises        Current Outpatient Prescriptions:   .  cetirizine (ZYRTEC) 10 MG tablet, Take 10 mg by mouth daily as needed. , Disp: , Rfl:  .  losartan (COZAAR) 50 MG tablet, Take 1 tablet (50 mg total) by mouth daily., Disp: 90 tablet, Rfl: 3 .  sitaGLIPtin-metformin (JANUMET) 50-500 MG tablet, Take 1 tablet by mouth 2 (two) times daily with a meal., Disp: 180 tablet, Rfl: 1  EXAM:  Vitals:   09/16/16 0702  BP: 136/80  Pulse: 90  Temp: 97.7 F (36.5 C)  TempSrc: Oral  Weight: 184 lb 8 oz (83.7 kg)  Height: 5' 7"  (1.702 m)    Estimated body mass index is 28.9 kg/m as calculated from the following:   Height as of this encounter: 5' 7"  (1.702 m).   Weight as of this encounter: 184 lb 8 oz (83.7 kg).  GENERAL: vitals reviewed and listed below, alert, oriented, appears well hydrated and in no acute distress  HEENT: head atraumatic, PERRLA, normal appearance of eyes, ears, nose and mouth. moist mucus membranes.  NECK: supple, no masses or lymphadenopathy  LUNGS: clear to auscultation bilaterally, no rales, rhonchi or wheeze  CV: HRRR, no peripheral edema or cyanosis, normal pedal pulses  ABDOMEN: bowel sounds normal, soft, non tender to palpation, no masses, no rebound or guarding  GU: declined  SKIN: no rash or abnormal lesions  MS: normal gait, moves all extremities normally  NEURO: normal gait, speech and thought processing grossly intact, muscle tone grossly intact throughout  PSYCH: normal affect, pleasant and cooperative  ASSESSMENT AND PLAN:  Discussed the following assessment and plan:  Encounter for preventive health examination  Type 2 diabetes mellitus without complication, without long-term current use of insulin ([place]) - Plan: Hemoglobin A1c, Microalbumin/Creatinine Ratio, Urine  Hyperlipidemia, unspecified hyperlipidemia type - Plan: Lipid Panel  Essential hypertension - Plan: BMP with eGFR, CBC (no diff)  BMI 28.0-28.9,adult  -Discussed and advised all [place] preventive services health task force  level A and B recommendations for age, sex and risks.  --Advised at least 150 minutes of exercise per week and a healthy diet with avoidance of (less then 1 serving per week) processed foods, white starches, red meat, fast foods and sweets and consisting of: * 5-9 servings of fresh fruits and vegetables (not corn or potatoes) *nuts and seeds, beans *olives and olive oil *lean meats such as fish and white chicken  *whole grains  -FASTING labs, studies and vaccines per orders this encounter  -advised to restart losartan and statin, lengthy discussion risks/benefits, he agreed to start antihypertensive but not the statin.  Patient advised to return to clinic immediately if symptoms worsen or persist or new concerns.  Patient Instructions  BEFORE YOU LEAVE: -labs -nurse visit for pneumococal vaccine -follow up: 3 months  Please start taking the losartan every day in the morning.  We have ordered labs or studies at this visit. It can take up to 1-2 weeks for results and processing. IF results require follow up or explanation, we will call you with instructions. Clinically stable results will be released to your [facility]. If you have not heard from [place] or cannot find your results in [facility] in 2 weeks please contact our office at [phone].   If you are not yet signed up for [facility], please SIGN UP TODAY. We now offer online scheduling, same day appointments and extended hours. WHEN YOU DON'T FEEL YOUR BEST.Marland KitchenMarland KitchenWE ARE HERE TO HELP.   We recommend the following healthy lifestyle for LIFE: 1) Small portions.   Tip: eat off of a salad plate instead of a dinner plate.  Tip: if you need more or a snack choose fruits, veggies and/or a handful of nuts or seeds.  2) Eat a healthy clean diet.  * Tip: Avoid (less then 1 serving per week): processed foods, sweets, sweetened drinks, white starches (rice, flour, bread, potatoes, pasta, etc), red meat, fast foods, butter  *Tip: CHOOSE instead   *  5-9 servings per day of fresh or frozen fruits and vegetables (but not corn, potatoes, bananas, canned or dried fruit)   *nuts and seeds, beans   *olives and olive oil   *small portions of lean meats such as fish and white chicken    *small portions of whole grains  3)Get at least 150 minutes of sweaty aerobic exercise per week.  4)Reduce stress - consider counseling, meditation and relaxation to balance other aspects of your life.            No Follow-up on file.   Colin Benton R., DO

## 2016-09-16 ENCOUNTER — Ambulatory Visit (INDEPENDENT_AMBULATORY_CARE_PROVIDER_SITE_OTHER): Payer: BLUE CROSS/BLUE SHIELD | Admitting: Family Medicine

## 2016-09-16 ENCOUNTER — Encounter: Payer: Self-pay | Admitting: Family Medicine

## 2016-09-16 VITALS — BP 136/80 | HR 90 | Temp 97.7°F | Ht 67.0 in | Wt 184.5 lb

## 2016-09-16 DIAGNOSIS — E785 Hyperlipidemia, unspecified: Secondary | ICD-10-CM

## 2016-09-16 DIAGNOSIS — Z0000 Encounter for general adult medical examination without abnormal findings: Secondary | ICD-10-CM

## 2016-09-16 DIAGNOSIS — E119 Type 2 diabetes mellitus without complications: Secondary | ICD-10-CM | POA: Diagnosis not present

## 2016-09-16 DIAGNOSIS — I10 Essential (primary) hypertension: Secondary | ICD-10-CM | POA: Diagnosis not present

## 2016-09-16 DIAGNOSIS — Z6828 Body mass index (BMI) 28.0-28.9, adult: Secondary | ICD-10-CM

## 2016-09-16 LAB — BASIC METABOLIC PANEL
BUN: 10 mg/dL (ref 6–23)
CO2: 31 mEq/L (ref 19–32)
Calcium: 9.9 mg/dL (ref 8.4–10.5)
Chloride: 101 mEq/L (ref 96–112)
Creatinine, Ser: 0.92 mg/dL (ref 0.40–1.50)
GFR: 96.04 mL/min (ref >60.00)
Glucose, Bld: 104 mg/dL — ABNORMAL HIGH (ref 70–99)
POTASSIUM: 5.1 meq/L (ref 3.5–5.1)
Sodium: 139 mEq/L (ref 135–145)

## 2016-09-16 LAB — LIPID PANEL
CHOL/HDL RATIO: 4
Cholesterol: 199 mg/dL (ref 0–200)
HDL: 44.7 mg/dL (ref >39.00)
LDL CALC: 133 mg/dL — AB (ref 0–99)
NonHDL: 154.2
Triglycerides: 106 mg/dL (ref 0.0–149.0)
VLDL: 21.2 mg/dL (ref 0.0–40.0)

## 2016-09-16 LAB — CBC
HCT: 48.1 % (ref 39.0–52.0)
Hemoglobin: 16.1 g/dL (ref 13.0–17.0)
MCHC: 33.5 g/dL (ref 30.0–36.0)
MCV: 80.7 fl (ref 78.0–100.0)
Platelets: 317 10*3/uL (ref 150.0–400.0)
RBC: 5.96 Mil/uL — ABNORMAL HIGH (ref 4.22–5.81)
RDW: 13.6 % (ref 11.5–15.5)
WBC: 6.9 10*3/uL (ref 4.0–10.5)

## 2016-09-16 LAB — MICROALBUMIN / CREATININE URINE RATIO
CREATININE, U: 21.4 mg/dL
Microalb Creat Ratio: 3.3 mg/g (ref 0.0–30.0)

## 2016-09-16 LAB — HEMOGLOBIN A1C: Hgb A1c MFr Bld: 6 % (ref 4.6–6.5)

## 2016-09-16 NOTE — Addendum Note (Signed)
Addended by: Bonnye FavaKWEI, NANA K on: 09/16/2016 08:07 AM   Modules accepted: Orders

## 2016-09-16 NOTE — Progress Notes (Signed)
Pre visit review using our clinic review tool, if applicable. No additional management support is needed unless otherwise documented below in the visit note. 

## 2016-09-16 NOTE — Patient Instructions (Signed)
BEFORE YOU LEAVE: -labs -nurse visit for pneumococal vaccine -follow up: 3 months  Please start taking the losartan every day in the morning.  We have ordered labs or studies at this visit. It can take up to 1-2 weeks for results and processing. IF results require follow up or explanation, we will call you with instructions. Clinically stable results will be released to your [facility]. If you have not heard from us or cannot find your results in [facility] in 2 weeks please contact our office at [phone].   If you are not yet signed up for [facility], please SIGN UP TODAY. We now offer online scheduling, same day appointments and extended hours. WHEN YOU DON'T FEEL YOUR BEST.Marland Kitchen.Marland Kitchen.WE ARE HERE TO HELP.   We recommend the following healthy lifestyle for LIFE: 1) Small portions.   Tip: eat off of a salad plate instead of a dinner plate.  Tip: if you need more or a snack choose fruits, veggies and/or a handful of nuts or seeds.  2) Eat a healthy clean diet.  * Tip: Avoid (less then 1 serving per week): processed foods, sweets, sweetened drinks, white starches (rice, flour, bread, potatoes, pasta, etc), red meat, fast foods, butter  *Tip: CHOOSE instead   * 5-9 servings per day of fresh or frozen fruits and vegetables (but not corn, potatoes, bananas, canned or dried fruit)   *nuts and seeds, beans   *olives and olive oil   *small portions of lean meats such as fish and white chicken    *small portions of whole grains  3)Get at least 150 minutes of sweaty aerobic exercise per week.  4)Reduce stress - consider counseling, meditation and relaxation to balance other aspects of your life.

## 2016-09-17 ENCOUNTER — Telehealth: Payer: Self-pay | Admitting: Family Medicine

## 2016-09-17 NOTE — Telephone Encounter (Signed)
See results note. 

## 2016-09-17 NOTE — Telephone Encounter (Signed)
° ° ° °  Pt would like a call back concerning his labs, he saw them on my chart and would like to discuss

## 2016-12-16 ENCOUNTER — Ambulatory Visit: Payer: Self-pay | Admitting: Family Medicine

## 2016-12-21 NOTE — Progress Notes (Signed)
HPI:   Martin Sanchez is a pleasant 41 y.o. here for follow up. Chronic medical problems summarized below were reviewed for changes and stability and were updated as needed below. These issues and their treatment remain stable for the most part. Reports doing great. Jogging 3 days per week. 8000 steps per day minimum. Has made big changes to diet. He stopped the losartan and is not taking the cholesterol medication. Fasting BS 90-100.Marland Kitchen Denies CP, SOB, hypoglycemia, wounds, vision changes, DOE, treatment intolerance or new symptoms. Due for labs  DM: -complications: none known -dx in 2017 in [place] with neg optho exam and EKG at the time -initial hgba1c 10.9 (6/17) --> 6.1! Mostly with lifestyle changes -meds: janumet  HLD: -he wanted to do lifestyle changes - see above -is against taking a cholesterol medication - finally agreed after last labs - but then did not take -denies hx statin intolerance  Elevated Blood : -he did not want to do blood pressure medication - finally agreed to losartan -denies: CP, SOB, DOE, HA, vision changes  Elevated liver enzymes: -in [place] -resolved off alcohol  ROS: See pertinent positives and negatives per HPI.  Past Medical History:  Diagnosis Date  . BMI 28.0-28.9,adult 06/11/2016  . Essential hypertension 06/11/2016  . Hyperlipemia 03/08/2016  . Type 2 diabetes mellitus without complication, without long-term current use of insulin ([facility]) 03/08/2016    No past surgical history on file.  No family history on file.  Social History   Social History  . Marital status: Married    Spouse name: N/A  . Number of children: N/A  . Years of education: N/A   Social History Main Topics  . Smoking status: Never Smoker  . Smokeless tobacco: Never Used  . Alcohol use Yes  . Drug use: No  . Sexual activity: Not Asked   Other Topics Concern  . None   Social History Narrative   Work or School: Art gallery manager - Therapist, sports Situation: lives  with wife and son      Spiritual Beliefs: Hindu      Lifestyle: in 2017 dx with diabetes, working on improving diet and exercises        Current Outpatient Prescriptions:  .  sitaGLIPtin-metformin (JANUMET) 50-500 MG tablet, Take 1 tablet by mouth 2 (two) times daily with a meal., Disp: 180 tablet, Rfl: 1 .  rosuvastatin (CRESTOR) 5 MG tablet, Take 1 tablet (5 mg total) by mouth daily., Disp: 90 tablet, Rfl: 3  EXAM:  Vitals:   12/23/16 0710  BP: (!) 136/92  Pulse: 73  Temp: 97.5 F (36.4 C)    Body mass index is 28.25 kg/m.  GENERAL: vitals reviewed and listed above, alert, oriented, appears well hydrated and in no acute distress  HEENT: atraumatic, conjunttiva clear, no obvious abnormalities on inspection of external nose and ears  NECK: no obvious masses on inspection  LUNGS: clear to auscultation bilaterally, no wheezes, rales or rhonchi, good air movement  CV: HRRR, no peripheral edema  MS: moves all extremities without noticeable abnormality  PSYCH: pleasant and cooperative, no obvious depression or anxiety  ASSESSMENT AND PLAN:  Discussed the following assessment and plan:  Type 2 diabetes mellitus without complication, without long-term current use of insulin (HCC) - Plan: Hemoglobin A1c  Hyperlipidemia, unspecified hyperlipidemia type - Plan: Lipid panel  Essential hypertension - Plan: Basic metabolic panel, CBC  -lifestyle and med recs - see pt instructions. -fasting labs -Patient advised to return or notify  a doctor immediately if symptoms worsen or persist or new concerns arise.  Patient Instructions  BEFORE YOU LEAVE: -follow up: 3 months -labs  START the losartan and take every day.  START the crestor and take every day.  Continue the diabetes medication.  Continue the healthy diet and regular exercise!  We have ordered labs or studies at this visit. It can take up to 1-2 weeks for results and processing. IF results require follow up or  explanation, we will call you with instructions. Clinically stable results will be released to your [facility]. If you have not heard from [place] or cannot find your results in [facility] in 2 weeks please contact our office at [phone].  If you are not yet signed up for [facility], please consider signing up.  WE NOW OFFER   [facility] FAST TRACK!!!  SAME DAY Appointments for ACUTE CARE  Such as: Sprains, Injuries, cuts, abrasions, rashes, muscle pain, joint pain, back pain Colds, flu, sore throats, headache, allergies, cough, fever  Ear pain, sinus and eye infections Abdominal pain, nausea, vomiting, diarrhea, upset stomach Animal/insect bites  3 Easy Ways to Schedule: Walk-In Scheduling Call in scheduling Mychart Sign-up: [email or web address]                 Kriste Basque R., DO

## 2016-12-23 ENCOUNTER — Encounter: Payer: Self-pay | Admitting: Family Medicine

## 2016-12-23 ENCOUNTER — Ambulatory Visit (INDEPENDENT_AMBULATORY_CARE_PROVIDER_SITE_OTHER): Payer: BLUE CROSS/BLUE SHIELD | Admitting: Family Medicine

## 2016-12-23 VITALS — BP 136/92 | HR 73 | Temp 97.5°F | Ht 67.0 in | Wt 180.4 lb

## 2016-12-23 DIAGNOSIS — E785 Hyperlipidemia, unspecified: Secondary | ICD-10-CM | POA: Diagnosis not present

## 2016-12-23 DIAGNOSIS — E119 Type 2 diabetes mellitus without complications: Secondary | ICD-10-CM | POA: Diagnosis not present

## 2016-12-23 DIAGNOSIS — I10 Essential (primary) hypertension: Secondary | ICD-10-CM

## 2016-12-23 LAB — LIPID PANEL
CHOLESTEROL: 186 mg/dL (ref 0–200)
HDL: 46.3 mg/dL (ref >39.00)
LDL Cholesterol: 113 mg/dL — ABNORMAL HIGH (ref 0–99)
NONHDL: 139.42
Total CHOL/HDL Ratio: 4
Triglycerides: 131 mg/dL (ref 0.0–149.0)
VLDL: 26.2 mg/dL (ref 0.0–40.0)

## 2016-12-23 LAB — BASIC METABOLIC PANEL
BUN: 11 mg/dL (ref 6–23)
CALCIUM: 9.5 mg/dL (ref 8.4–10.5)
CHLORIDE: 102 meq/L (ref 96–112)
CO2: 31 meq/L (ref 19–32)
CREATININE: 0.93 mg/dL (ref 0.40–1.50)
GFR: 94.72 mL/min (ref >60.00)
Glucose, Bld: 91 mg/dL (ref 70–99)
Potassium: 4.8 mEq/L (ref 3.5–5.1)
Sodium: 139 mEq/L (ref 135–145)

## 2016-12-23 LAB — CBC
HEMATOCRIT: 47.7 % (ref 39.0–52.0)
Hemoglobin: 16 g/dL (ref 13.0–17.0)
MCHC: 33.6 g/dL (ref 30.0–36.0)
MCV: 81.8 fl (ref 78.0–100.0)
Platelets: 289 10*3/uL (ref 150.0–400.0)
RBC: 5.84 Mil/uL — ABNORMAL HIGH (ref 4.22–5.81)
RDW: 13.8 % (ref 11.5–15.5)
WBC: 6.5 10*3/uL (ref 4.0–10.5)

## 2016-12-23 LAB — HEMOGLOBIN A1C: HEMOGLOBIN A1C: 5.9 % (ref 4.6–6.5)

## 2016-12-23 MED ORDER — ROSUVASTATIN CALCIUM 5 MG PO TABS: 5.0000 mg | ORAL_TABLET | Freq: Every day | ORAL | 3 refills | Status: DC

## 2016-12-23 NOTE — Progress Notes (Signed)
Pre visit review using our clinic review tool, if applicable. No additional management support is needed unless otherwise documented below in the visit note. 

## 2016-12-23 NOTE — Patient Instructions (Signed)
BEFORE YOU LEAVE: -follow up: 3 months -labs  START the losartan and take every day.  START the crestor and take every day.  Continue the diabetes medication.  Continue the healthy diet and regular exercise!  We have ordered labs or studies at this visit. It can take up to 1-2 weeks for results and processing. IF results require follow up or explanation, we will call you with instructions. Clinically stable results will be released to your [facility]. If you have not heard from us or cannot find your results in [facility] in 2 weeks please contact our office at [phone].  If you are not yet signed up for [facility], please consider signing up.  WE NOW OFFER   [facility] FAST TRACK!!!  SAME DAY Appointments for ACUTE CARE  Such as: Sprains, Injuries, cuts, abrasions, rashes, muscle pain, joint pain, back pain Colds, flu, sore throats, headache, allergies, cough, fever  Ear pain, sinus and eye infections Abdominal pain, nausea, vomiting, diarrhea, upset stomach Animal/insect bites  3 Easy Ways to Schedule: Walk-In Scheduling Call in scheduling Mychart Sign-up: [email or web address]

## 2017-02-16 ENCOUNTER — Other Ambulatory Visit: Payer: Self-pay | Admitting: Family Medicine

## 2017-02-16 MED ORDER — SITAGLIPTIN PHOS-METFORMIN HCL 50-500 MG PO TABS: 1.0000 | ORAL_TABLET | Freq: Two times a day (BID) | ORAL | 1 refills | Status: DC

## 2017-02-16 NOTE — Telephone Encounter (Signed)
Patient needs refill on Rx:  sitaGLIPtin-metformin (JANUMET) 50-500 MG tablet    [facility] [place] [address] [phone] (Phone) [phone] (Fax)

## 2017-03-24 NOTE — Progress Notes (Signed)
HPI:  Martin Sanchez is a pleasant 41 y.o. here for follow up. Chronic medical problems summarized below were reviewed for changes. Reports doing well for the most part. IS not taking statin - does not want to take medications. Exercising 30 minutes 3 days per week. Reports ate poorly for 1 week, but now back on track. He has a new concern of a mass on his right upper leg that is quite anxious about. See below for details. Denies CP, SOB, DOE, treatment intolerance or new symptoms. She really wants to check his labs today.  Mass on leg: -Noticed a few weeks ago and has been stable -Nontender, mass on the upper right leg -Denies fevers, malaise, other masses, injury here, redness  DM: -complications: none known -dx in 2017 in [place] with neg optho exam and EKG at the time -initial hgba1c 10.9 (6/17) -->6.1! Mostly with lifestyle changes -meds: janumet  HLD: -he wanted to do lifestyle changes - see above -is against taking a cholesterol medication - finally agreed after last labs - but then did not take -denies hx statin intolerance  Elevated Blood : -he did not want to do blood pressure medication - finally agreed to losartan -denies: CP, SOB, DOE, HA, vision changes  Elevated liver enzymes: -in [place] -resolved off alcohol  ROS: See pertinent positives and negatives per HPI.  Past Medical History:  Diagnosis Date  . BMI 28.0-28.9,adult 06/11/2016  . Essential hypertension 06/11/2016  . Hyperlipemia 03/08/2016  . Type 2 diabetes mellitus without complication, without long-term current use of insulin (HCC) 03/08/2016    No past surgical history on file.  No family history on file.  Social History   Social History  . Marital status: Married    Spouse name: N/A  . Number of children: N/A  . Years of education: N/A   Social History Main Topics  . Smoking status: Never Smoker  . Smokeless tobacco: Never Used  . Alcohol use Yes  . Drug use: No  . Sexual activity: Not  Asked   Other Topics Concern  . None   Social History Narrative   Work or School: Art gallery managerengineer - Therapist, sportsindustrial      Home Situation: lives with wife and son      Spiritual Beliefs: Hindu      Lifestyle: in 2017 dx with diabetes, working on improving diet and exercises        Current Outpatient Prescriptions:  .  sitaGLIPtin-metformin (JANUMET) 50-500 MG tablet, Take 1 tablet by mouth 2 (two) times daily with a meal., Disp: 180 tablet, Rfl: 1  EXAM:  Vitals:   03/25/17 0833 03/25/17 0846  BP: 136/88 140/80  Pulse: 80   Temp: 98.2 F (36.8 C)     Body mass index is 28.04 kg/m.  GENERAL: vitals reviewed and listed above, alert, oriented, appears well hydrated and in no acute distress  HEENT: atraumatic, conjunttiva clear, no obvious abnormalities on inspection of external nose and ears  NECK: no obvious masses on inspection  LUNGS: clear to auscultation bilaterally, no wheezes, rales or rhonchi, good air movement  CV: HRRR, no peripheral edema  MS: moves all extremities without noticeable abnormality  SKIN: Rubbery in texture, similar mobile, nontender, superficial subcutaneous mass on the right upper leg lateral to the groin region, approximately 2 cm in diameter, no overlying skin findings  PSYCH: pleasant and cooperative, no obvious depression or anxiety  ASSESSMENT AND PLAN:  Discussed the following assessment and plan:  Type 2 diabetes mellitus without complication,  without long-term current use of insulin (HCC) - Plan: Hemoglobin A1c -Continue current medications and a healthy low sugar diet and regular exercise  Hyperlipidemia associated with type 2 diabetes mellitus (HCC) - Plan: Lipid panel -Is refusing treatment with medications for now, he does want to check his lipids today again  Hypertension associated with diabetes (HCC) - Plan: Basic metabolic panel, CBC -Advised an acei or arb for his blood pressure, discussed risks of elevated blood pressure  particularly in the setting of diabetes, he is refusing medication and wishes to continue to work on lifestyle changes  Mass of right lower extremity - Plan: Ambulatory referral to General Surgery -Quite anxious about this and he does desire a definitive diagnosis, discussed potential etiologies and options for evaluation, he has opted to see general surgery, referral placed  -Patient advised to return or notify a doctor immediately if symptoms worsen or persist or new concerns arise.  Patient Instructions  BEFORE YOU LEAVE: -follow up: 3-4 months -labs  -We placed a referral for you as discussed to the general surgeon. It usually takes about 1-2 weeks to process and schedule this referral. If you have not heard from us regarding this appointment in 2 weeks please contact our office.  We have ordered labs or studies at this visit. It can take up to 1-2 weeks for results and processing. IF results require follow up or explanation, we will call you with instructions. Clinically stable results will be released to your [facility]. If you have not heard from us or cannot find your results in [facility] in 2 weeks please contact our office at [phone].  If you are not yet signed up for [facility], please consider signing up.    We recommend the following healthy lifestyle for LIFE: 1) Small portions.   Tip: eat off of a salad plate instead of a dinner plate.  Tip: It is ok to feel hungry after a meal - that likely means you ate an appropriate portion.  Tip: if you need more or a snack choose fruits, veggies and/or a handful of nuts or seeds.  2) Eat a healthy clean diet.   TRY TO EAT: -at least 5-7 servings of low sugar vegetables per day (not corn, potatoes or bananas.) -berries are the best choice if you wish to eat fruit.   -lean meets (fish, chicken or Mal[place]key breasts) -vegan proteins for some meals - beans or tofu, whole grains, nuts and seeds -Replace bad fats with good fats - good fats  include: fish, nuts and seeds, canola oil, olive oil -small amounts of low fat or non fat dairy -small amounts of100 % whole grains - check the lables  AVOID: -SUGAR, sweets, anything with added sugar, corn syrup or sweeteners -if you must have a sweetener, small amounts of stevia may be best -sweetened beverages -simple starches (rice, bread, potatoes, pasta, chips, etc - small amounts of 100% whole grains are ok) -red meat, pork, butter -fried foods, fast food, processed food, excessive dairy, eggs and coconut.  3)Get at least 150 minutes of sweaty aerobic exercise per week.  4)Reduce stress - consider counseling, meditation and relaxation to balance other aspects of your life.    Kriste BasqueKIM, HANNAH R., DO

## 2017-03-25 ENCOUNTER — Ambulatory Visit (INDEPENDENT_AMBULATORY_CARE_PROVIDER_SITE_OTHER): Payer: BLUE CROSS/BLUE SHIELD | Admitting: Family Medicine

## 2017-03-25 ENCOUNTER — Encounter: Payer: Self-pay | Admitting: Family Medicine

## 2017-03-25 VITALS — BP 140/80 | HR 80 | Temp 98.2°F | Ht 67.0 in | Wt 179.0 lb

## 2017-03-25 DIAGNOSIS — E1169 Type 2 diabetes mellitus with other specified complication: Secondary | ICD-10-CM

## 2017-03-25 DIAGNOSIS — R2241 Localized swelling, mass and lump, right lower limb: Secondary | ICD-10-CM | POA: Diagnosis not present

## 2017-03-25 DIAGNOSIS — I10 Essential (primary) hypertension: Secondary | ICD-10-CM

## 2017-03-25 DIAGNOSIS — E785 Hyperlipidemia, unspecified: Secondary | ICD-10-CM | POA: Diagnosis not present

## 2017-03-25 DIAGNOSIS — E1159 Type 2 diabetes mellitus with other circulatory complications: Secondary | ICD-10-CM | POA: Diagnosis not present

## 2017-03-25 DIAGNOSIS — E119 Type 2 diabetes mellitus without complications: Secondary | ICD-10-CM | POA: Diagnosis not present

## 2017-03-25 DIAGNOSIS — I152 Hypertension secondary to endocrine disorders: Secondary | ICD-10-CM

## 2017-03-25 LAB — LIPID PANEL
CHOLESTEROL: 197 mg/dL (ref 0–200)
HDL: 43.1 mg/dL (ref >39.00)
LDL CALC: 127 mg/dL — AB (ref 0–99)
NonHDL: 153.63
TRIGLYCERIDES: 131 mg/dL (ref 0.0–149.0)
Total CHOL/HDL Ratio: 5
VLDL: 26.2 mg/dL (ref 0.0–40.0)

## 2017-03-25 LAB — CBC
HCT: 48.9 % (ref 39.0–52.0)
Hemoglobin: 16.1 g/dL (ref 13.0–17.0)
MCHC: 33 g/dL (ref 30.0–36.0)
MCV: 83.5 fl (ref 78.0–100.0)
Platelets: 285 10*3/uL (ref 150.0–400.0)
RBC: 5.85 Mil/uL — ABNORMAL HIGH (ref 4.22–5.81)
RDW: 13.5 % (ref 11.5–15.5)
WBC: 5.9 10*3/uL (ref 4.0–10.5)

## 2017-03-25 LAB — BASIC METABOLIC PANEL
BUN: 12 mg/dL (ref 6–23)
CALCIUM: 9.8 mg/dL (ref 8.4–10.5)
CO2: 33 meq/L — AB (ref 19–32)
CREATININE: 0.93 mg/dL (ref 0.40–1.50)
Chloride: 101 mEq/L (ref 96–112)
GFR: 94.61 mL/min (ref >60.00)
GLUCOSE: 96 mg/dL (ref 70–99)
Potassium: 5.3 mEq/L — ABNORMAL HIGH (ref 3.5–5.1)
Sodium: 138 mEq/L (ref 135–145)

## 2017-03-25 LAB — HEMOGLOBIN A1C: Hgb A1c MFr Bld: 5.8 % (ref 4.6–6.5)

## 2017-03-25 NOTE — Patient Instructions (Addendum)
BEFORE YOU LEAVE: -follow up: 3-4 months -labs  -We placed a referral for you as discussed to the general surgeon. It usually takes about 1-2 weeks to process and schedule this referral. If you have not heard from us regarding this appointment in 2 weeks please contact our office.  We have ordered labs or studies at this visit. It can take up to 1-2 weeks for results and processing. IF results require follow up or explanation, we will call you with instructions. Clinically stable results will be released to your [facility]. If you have not heard from us or cannot find your results in [facility] in 2 weeks please contact our office at [phone].  If you are not yet signed up for [facility], please consider signing up.    We recommend the following healthy lifestyle for LIFE: 1) Small portions.   Tip: eat off of a salad plate instead of a dinner plate.  Tip: It is ok to feel hungry after a meal - that likely means you ate an appropriate portion.  Tip: if you need more or a snack choose fruits, veggies and/or a handful of nuts or seeds.  2) Eat a healthy clean diet.   TRY TO EAT: -at least 5-7 servings of low sugar vegetables per day (not corn, potatoes or bananas.) -berries are the best choice if you wish to eat fruit.   -lean meets (fish, chicken or [place]key breasts) -vegan proteins for some meals - beans or tofu, whole grains, nuts and seeds -Replace bad fats with good fats - good fats include: fish, nuts and seeds, canola oil, olive oil -small amounts of low fat or non fat dairy -small amounts of100 % whole grains - check the lables  AVOID: -SUGAR, sweets, anything with added sugar, corn syrup or sweeteners -if you must have a sweetener, small amounts of stevia may be best -sweetened beverages -simple starches (rice, bread, potatoes, pasta, chips, etc - small amounts of 100% whole grains are ok) -red meat, pork, butter -fried foods, fast food, processed food, excessive dairy, eggs and  coconut.  3)Get at least 150 minutes of sweaty aerobic exercise per week.  4)Reduce stress - consider counseling, meditation and relaxation to balance other aspects of your life.

## 2017-04-26 ENCOUNTER — Other Ambulatory Visit: Payer: Self-pay | Admitting: Surgery

## 2017-04-26 ENCOUNTER — Ambulatory Visit: Payer: Self-pay | Admitting: Surgery

## 2017-04-26 DIAGNOSIS — R2241 Localized swelling, mass and lump, right lower limb: Secondary | ICD-10-CM

## 2017-04-27 ENCOUNTER — Ambulatory Visit
Admission: RE | Admit: 2017-04-27 | Discharge: 2017-04-27 | Disposition: A | Discharge status: Home / Self Care | Payer: BLUE CROSS/BLUE SHIELD | Source: Ambulatory Visit | Attending: Surgery | Admitting: Surgery

## 2017-04-27 DIAGNOSIS — R2241 Localized swelling, mass and lump, right lower limb: Secondary | ICD-10-CM | POA: Diagnosis not present

## 2017-04-29 ENCOUNTER — Other Ambulatory Visit: Payer: Self-pay | Admitting: Surgery

## 2017-04-29 DIAGNOSIS — R2241 Localized swelling, mass and lump, right lower limb: Secondary | ICD-10-CM

## 2017-05-02 ENCOUNTER — Other Ambulatory Visit: Payer: Self-pay | Admitting: Surgery

## 2017-05-02 DIAGNOSIS — R2241 Localized swelling, mass and lump, right lower limb: Secondary | ICD-10-CM

## 2017-05-08 ENCOUNTER — Other Ambulatory Visit: Payer: BLUE CROSS/BLUE SHIELD

## 2017-05-09 ENCOUNTER — Other Ambulatory Visit: Payer: BLUE CROSS/BLUE SHIELD

## 2017-05-12 ENCOUNTER — Encounter: Payer: Self-pay | Admitting: Family Medicine

## 2017-05-13 ENCOUNTER — Ambulatory Visit (HOSPITAL_COMMUNITY)
Admission: RE | Admit: 2017-05-13 | Discharge: 2017-05-13 | Disposition: A | Discharge status: Home / Self Care | Payer: BLUE CROSS/BLUE SHIELD | Source: Ambulatory Visit | Attending: Surgery | Admitting: Surgery

## 2017-05-13 DIAGNOSIS — R1909 Other intra-abdominal and pelvic swelling, mass and lump: Secondary | ICD-10-CM | POA: Diagnosis not present

## 2017-05-13 DIAGNOSIS — R590 Localized enlarged lymph nodes: Secondary | ICD-10-CM | POA: Diagnosis not present

## 2017-05-13 DIAGNOSIS — R2241 Localized swelling, mass and lump, right lower limb: Secondary | ICD-10-CM | POA: Diagnosis not present

## 2017-05-13 HISTORY — PX: LYMPH NODE BIOPSY: SHX201

## 2017-05-13 LAB — POCT I-STAT CREATININE: CREATININE: 1 mg/dL (ref 0.61–1.24)

## 2017-05-13 MED ORDER — GADOBENATE DIMEGLUMINE 529 MG/ML IV SOLN
20.0000 mL | Freq: Once | INTRAVENOUS | Status: AC | PRN
  Administered 2017-05-13: 17 mL via INTRAVENOUS

## 2017-05-15 ENCOUNTER — Ambulatory Visit: Payer: Self-pay | Admitting: Surgery

## 2017-05-15 NOTE — Progress Notes (Signed)
Please let the patient know that the MRI showed two large suspicious lymph nodes in the right groin.  He needs to have a right inguinal lymph node biopsy.  I will send the posting info via Epic.  If he wants to discuss this with me in person, I can see him this week.

## 2017-05-15 NOTE — H&P (Signed)
Martin Sanchez is an 41 y.o. male.   Chief Complaint: Right thigh mass  HPI: This is a 41 yo male referred by Dr. Kriste Basque for a few week history of a palpable mass in his right groin.  The mass is non-tender and has not enlarged since first being discovered.  He denies any recent trauma to this area.  He denies any night sweats, easy bruising, or unexplained weight loss.  He was evaluated in the office and MRI revealed that this represented lymphadenopathy in the right groin.  Past Medical History:  Diagnosis Date  . BMI 28.0-28.9,adult 06/11/2016  . Essential hypertension 06/11/2016  . Hyperlipemia 03/08/2016  . Type 2 diabetes mellitus without complication, without long-term current use of insulin (HCC) 03/08/2016    No past surgical history on file.  No family history on file. Social History:  reports that he has never smoked. He has never used smokeless tobacco. He reports that he drinks alcohol. He reports that he does not use drugs.  Allergies: No Known Allergies  Prior to Admission medications   Medication Sig Start Date End Date Taking? Authorizing Provider  sitaGLIPtin-metformin (JANUMET) 50-500 MG tablet Take 1 tablet by mouth 2 (two) times daily with a meal. 02/16/17   Terressa Koyanagi, DO     Results for orders placed or performed during the hospital encounter of 05/13/17 (from the past 48 hour(s))  I-STAT creatinine     Status: None   Collection Time: 05/13/17  5:07 PM  Result Value Ref Range   Creatinine, Ser 1.00 0.61 - 1.24 mg/dL   Mr Pelvis W Wo Contrast  Result Date: 05/14/2017 CLINICAL DATA:  Palpable mass in the right groin area. EXAM: MRI PELVIS WITHOUT AND WITH CONTRAST TECHNIQUE: Multiplanar multisequence MR imaging of the pelvis was performed both before and after administration of intravenous contrast. CONTRAST:  17mL MULTIHANCE GADOBENATE DIMEGLUMINE 529 MG/ML IV SOLN COMPARISON:  Ultrasound 04/27/2017 FINDINGS: Urinary Tract: The bladder is normal. No bladder  mass or bladder wall thickening. Bowel:  The visualized small bowel and colon are unremarkable. Vascular/Lymphatic: The vascular structures appear normal. No intrapelvic lymphadenopathy. Enlarged right inguinal lymph nodes. The largest node measures 31 x 21 x 16.5 mm and corresponds to the ultrasound examination. It appears cystic on the ultrasound examination but is a solid enhancing lymph node based on MR characteristics. A more medial and slightly superior lymph node measures 17 x 12.5 mm. No left-sided inguinal adenopathy. Reproductive: The prostate gland and seminal vesicles are. Small left scrotal hydrocele is noted. Other: No free pelvic fluid collections, pelvic mass or lymphadenopathy. Musculoskeletal: No significant bony findings. The hip and thigh musculature appears normal. IMPRESSION: 1. Two enlarged right inguinal lymph nodes the suspicious for lymphoma. Recommend biopsy. 2. No enlarged intrapelvic lymph nodes for pelvic mass. Electronically Signed   By: Rudie Meyer M.D.   On: 05/14/2017 09:54    ROS  There were no vitals taken for this visit. Physical Exam  WDWN in NAD Eyes:  Pupils equal, round; sclera anicteric HENT:  Oral mucosa moist; good dentition  Neck:  No masses palpated, no thyromegaly Lungs:  CTA bilaterally; normal respiratory effort CV:  Regular rate and rhythm; no murmurs; extremities well-perfused with no edema Abd:  +bowel sounds, soft, non-tender, no palpable organomegaly; no palpable hernias Right groin/ proximal right thigh - 3 cm below inguinal crease - firm, oval-shaped 3 x 2 cm mass - appears to be subfascial Skin:  Warm, dry; no sign of jaundice Psychiatric -  alert and oriented x 4; calm mood and affect  Assessment/Plan Right inguinal lymphadenopathy - 3 cm and 1.7 cm.    Recommend right inguinal lymph node biopsy.  The surgical procedure has been discussed with the patient.  Potential risks, benefits, alternative treatments, and expected outcomes have  been explained.  All of the patient's questions at this time have been answered.  The likelihood of reaching the patient's treatment goal is good.  The patient understand the proposed surgical procedure and wishes to proceed.   Wynona Luna., MD 05/15/2017, 11:43 AM

## 2017-05-24 ENCOUNTER — Other Ambulatory Visit (HOSPITAL_COMMUNITY)
Admission: RE | Admit: 2017-05-24 | Discharge: 2017-05-24 | Disposition: A | Discharge status: Home / Self Care | Payer: BLUE CROSS/BLUE SHIELD | Source: Ambulatory Visit | Attending: Surgery | Admitting: Surgery

## 2017-05-24 ENCOUNTER — Other Ambulatory Visit: Payer: Self-pay | Admitting: Surgery

## 2017-05-24 DIAGNOSIS — R590 Localized enlarged lymph nodes: Secondary | ICD-10-CM | POA: Diagnosis not present

## 2017-05-24 DIAGNOSIS — R599 Enlarged lymph nodes, unspecified: Secondary | ICD-10-CM | POA: Diagnosis not present

## 2017-06-02 ENCOUNTER — Telehealth: Payer: Self-pay | Admitting: Hematology and Oncology

## 2017-06-02 ENCOUNTER — Encounter: Payer: Self-pay | Admitting: Hematology and Oncology

## 2017-06-02 NOTE — Telephone Encounter (Signed)
Appt has been scheduled for the pt to see Dr. Gweneth Dimitri on 10/29 at 11am. I was going to offer an ealier date, but he pt stated he could only come in the week of the 29th. Address and insurance verified. Letter mailed.   I cld Kim at [facility] to verify that this was a hematology referral and she confirmed.

## 2017-06-16 ENCOUNTER — Encounter: Payer: Self-pay | Admitting: Family Medicine

## 2017-06-16 DIAGNOSIS — R590 Localized enlarged lymph nodes: Secondary | ICD-10-CM | POA: Insufficient documentation

## 2017-06-20 ENCOUNTER — Telehealth: Payer: Self-pay | Admitting: Hematology and Oncology

## 2017-06-20 ENCOUNTER — Ambulatory Visit (HOSPITAL_BASED_OUTPATIENT_CLINIC_OR_DEPARTMENT_OTHER): Payer: BLUE CROSS/BLUE SHIELD | Admitting: Hematology and Oncology

## 2017-06-20 ENCOUNTER — Ambulatory Visit (HOSPITAL_BASED_OUTPATIENT_CLINIC_OR_DEPARTMENT_OTHER): Payer: BLUE CROSS/BLUE SHIELD

## 2017-06-20 ENCOUNTER — Encounter: Payer: Self-pay | Admitting: Hematology and Oncology

## 2017-06-20 VITALS — BP 151/101 | HR 73 | Temp 98.2°F | Resp 20 | Ht 67.0 in | Wt 178.3 lb

## 2017-06-20 DIAGNOSIS — E119 Type 2 diabetes mellitus without complications: Secondary | ICD-10-CM

## 2017-06-20 DIAGNOSIS — R599 Enlarged lymph nodes, unspecified: Secondary | ICD-10-CM

## 2017-06-20 DIAGNOSIS — R2241 Localized swelling, mass and lump, right lower limb: Secondary | ICD-10-CM

## 2017-06-20 DIAGNOSIS — R590 Localized enlarged lymph nodes: Secondary | ICD-10-CM

## 2017-06-20 LAB — COMPREHENSIVE METABOLIC PANEL
ALT: 22 U/L (ref 0–55)
AST: 19 U/L (ref 5–34)
Albumin: 4.3 g/dL (ref 3.5–5.0)
Alkaline Phosphatase: 55 U/L (ref 40–150)
Anion Gap: 10 mEq/L (ref 3–11)
BILIRUBIN TOTAL: 0.67 mg/dL (ref 0.20–1.20)
BUN: 8.3 mg/dL (ref 7.0–26.0)
CO2: 27 meq/L (ref 22–29)
CREATININE: 0.9 mg/dL (ref 0.7–1.3)
Calcium: 9.9 mg/dL (ref 8.4–10.4)
Chloride: 103 mEq/L (ref 98–109)
EGFR: >60 mL/min/{1.73_m2} (ref >60)
GLUCOSE: 100 mg/dL (ref 70–140)
Potassium: 4.7 mEq/L (ref 3.5–5.1)
SODIUM: 140 meq/L (ref 136–145)
TOTAL PROTEIN: 7.5 g/dL (ref 6.4–8.3)

## 2017-06-20 LAB — LACTATE DEHYDROGENASE: LDH: 137 U/L (ref 125–245)

## 2017-06-20 LAB — CBC WITH DIFFERENTIAL/PLATELET
BASO%: 0.7 % (ref 0.0–2.0)
Basophils Absolute: 0 10*3/uL (ref 0.0–0.1)
EOS%: 2.5 % (ref 0.0–7.0)
Eosinophils Absolute: 0.2 10*3/uL (ref 0.0–0.5)
HCT: 49.7 % (ref 38.4–49.9)
HGB: 16.9 g/dL (ref 13.0–17.1)
LYMPH%: 29.8 % (ref 14.0–49.0)
MCH: 28 pg (ref 27.2–33.4)
MCHC: 34 g/dL (ref 32.0–36.0)
MCV: 82.3 fL (ref 79.3–98.0)
MONO#: 0.3 10*3/uL (ref 0.1–0.9)
MONO%: 5.2 % (ref 0.0–14.0)
NEUT%: 61.8 % (ref 39.0–75.0)
NEUTROS ABS: 3.8 10*3/uL (ref 1.5–6.5)
NRBC: 0 % (ref 0–0)
Platelets: 261 10*3/uL (ref 140–400)
RBC: 6.04 10*6/uL — AB (ref 4.20–5.82)
RDW: 12.9 % (ref 11.0–14.6)
WBC: 6.1 10*3/uL (ref 4.0–10.3)
lymph#: 1.8 10*3/uL (ref 0.9–3.3)

## 2017-06-20 NOTE — Telephone Encounter (Signed)
Scheduled appt per 10/29 los - Gave patient AVS and calender per los.  

## 2017-06-21 LAB — SEDIMENTATION RATE: Sedimentation Rate-Westergren: 8 mm/hr (ref 0–15)

## 2017-06-21 LAB — C-REACTIVE PROTEIN: CRP: 5 mg/L — AB (ref 0.0–4.9)

## 2017-06-27 ENCOUNTER — Ambulatory Visit (HOSPITAL_COMMUNITY)
Admission: RE | Admit: 2017-06-27 | Discharge: 2017-06-27 | Disposition: A | Discharge status: Home / Self Care | Payer: BLUE CROSS/BLUE SHIELD | Source: Ambulatory Visit | Attending: Hematology and Oncology | Admitting: Hematology and Oncology

## 2017-06-27 ENCOUNTER — Ambulatory Visit (HOSPITAL_COMMUNITY): Payer: BLUE CROSS/BLUE SHIELD

## 2017-06-27 DIAGNOSIS — R599 Enlarged lymph nodes, unspecified: Secondary | ICD-10-CM

## 2017-06-27 DIAGNOSIS — R2241 Localized swelling, mass and lump, right lower limb: Secondary | ICD-10-CM

## 2017-06-27 DIAGNOSIS — R590 Localized enlarged lymph nodes: Secondary | ICD-10-CM | POA: Diagnosis not present

## 2017-06-27 MED ORDER — IOPAMIDOL (ISOVUE-300) INJECTION 61%
INTRAVENOUS | Status: AC
  Filled 2017-06-27: qty 100

## 2017-06-27 MED ORDER — IOPAMIDOL (ISOVUE-300) INJECTION 61%
100.0000 mL | Freq: Once | INTRAVENOUS | Status: AC | PRN
  Administered 2017-06-27: 100 mL via INTRAVENOUS

## 2017-06-30 ENCOUNTER — Encounter: Payer: Self-pay | Admitting: Hematology and Oncology

## 2017-06-30 NOTE — Progress Notes (Signed)
[facility] New Visit:  Assessment: LAD (lymphadenopathy), inguinal 41 y.o. male presenting with large right inguinal/upper femoral mass that turned out to be a localized lymphadenopathy with florid lymphoid hyperplasia.  This may represent a reactive change due to an irritant or infectious agent exposure versus precursor to development of a lymphoproliferative process.  First step would be to assess for possible presence of pathological lymphadenopathy in any other areas of the body.  If none are present, observation is adequate at this time.  Plan: --Labs today as below --CT N/C/A/P --Return to clinic in 1 week after the CT scan to review the findings  Voice recognition software was used and creation of this note. Despite my best effort at editing the text, some misspelling/errors may have occurred.  Orders Placed This Encounter  Procedures  . CT Soft Tissue Neck W Contrast    Standing Status:   Future    Number of Occurrences:   1    Standing Expiration Date:   06/20/2018    Order Specific Question:   If indicated for the ordered procedure, I authorize the administration of contrast media per Radiology protocol    Answer:   Yes    Order Specific Question:   Preferred imaging location?    Answer:   [facility]    Order Specific Question:   Radiology Contrast Protocol - do NOT remove file path    Answer:   [email or web address]    Order Specific Question:   Reason for Exam additional comments    Answer:   Right inguinal LAD, please eval for additional sites  . CT Chest W Contrast    Standing Status:   Future    Number of Occurrences:   1    Standing Expiration Date:   06/20/2018    Order Specific Question:   If indicated for the ordered procedure, I authorize the administration of contrast media per Radiology protocol    Answer:   Yes    Order Specific Question:   Preferred imaging location?    Answer:   [facility]   Order Specific Question:   Radiology Contrast Protocol - do NOT remove file path    Answer:   [email or web address]    Order Specific Question:   Reason for Exam additional comments    Answer:   Right inguinal LAD, please eval for additional sites  . CT Abdomen Pelvis W Contrast    Standing Status:   Future    Number of Occurrences:   1    Standing Expiration Date:   06/20/2018    Order Specific Question:   If indicated for the ordered procedure, I authorize the administration of contrast media per Radiology protocol    Answer:   Yes    Order Specific Question:   Preferred imaging location?    Answer:   [facility]    Order Specific Question:   Radiology Contrast Protocol - do NOT remove file path    Answer:   [email or web address]    Order Specific Question:   Reason for Exam additional comments    Answer:   Right inguinal LAD, please eval for additional sites  . CBC with Differential    Standing Status:   Future    Number of Occurrences:   1    Standing Expiration Date:   06/20/2018  . Comprehensive metabolic panel    Standing Status:   Future    Number of Occurrences:   1  Standing Expiration Date:   06/20/2018  . Lactate dehydrogenase (LDH)    Standing Status:   Future    Number of Occurrences:   1    Standing Expiration Date:   06/20/2018  . Sedimentation rate    Standing Status:   Future    Number of Occurrences:   1    Standing Expiration Date:   06/20/2018  . C-reactive protein    Standing Status:   Future    Number of Occurrences:   1    Standing Expiration Date:   06/20/2018    All questions were answered.  . The patient knows to call the clinic with any problems, questions or concerns.  This note was electronically signed.    History of Presenting Illness Martin Sanchez 41 y.o. presenting to the [facility] for diagnosis of florid lymphoid hyperplasia referred by Dr Donnie Mesa.  Patient initially  presented to attention of his caregivers after incidentally discovering a mass in the proximal right thigh medially.  He denies having any injury to the areas, he did not have any pain or discomfort, and there were no changes of the overlying skin.  The mass was present for approximately 2 weeks and appeared to have enlarged and patient sought medical care.  Initially, MRI pelvis was obtained on 05/13/17 demonstrating no evidence of pelvic lymphadenopathy, but the right inguinal lymphadenopathy with at least 2 lymph nodes with the largest one measuring 3.1 x 2.1 x 1.7 cm.  Due to suspicion for lymphoproliferative process, patient underwent an excisional lymph node biopsy on 05/24/17.Lymph node tissue demonstrated preserved architecture of the lymph node with florid follicular hyperplasia with reactive-appearing germinal centers.  No appreciable atypical cells.  No granuloma presence.  Flow cytometry obtained based on the tissue demonstrated no evidence of monoclonal lymphoid proliferation.  Patient denies any fevers, chills, night sweats.  No enlargement of lymph nodes in other areas of the body.  Denies respiratory, gastrointestinal, or genitourinary complaints.  No muscle or joint pains.  No significant weight loss or decreased ability of tolerating activities of daily living.  Oncological/hematological History:  Medical History: Past Medical History:  Diagnosis Date  . BMI 28.0-28.9,adult 06/11/2016  . Essential hypertension 06/11/2016  . Hyperlipemia 03/08/2016  . Type 2 diabetes mellitus without complication, without long-term current use of insulin ([place]) 03/08/2016    Surgical History: Past Surgical History:  Procedure Laterality Date  . LYMPH NODE BIOPSY Right 05/13/2017    Family History: Family History  Problem Relation Age of Onset  . Diabetes Father     Social History: Social History   Socioeconomic History  . Marital status: Married    Spouse name: Not on file  . Number of  children: Not on file  . Years of education: Not on file  . Highest education level: Not on file  Social Needs  . Financial resource strain: Not on file  . Food insecurity - worry: Not on file  . Food insecurity - inability: Not on file  . Transportation needs - medical: Not on file  . Transportation needs - non-medical: Not on file  Occupational History  . Not on file  Tobacco Use  . Smoking status: Never Smoker  . Smokeless tobacco: Never Used  Substance and Sexual Activity  . Alcohol use: No  . Drug use: No  . Sexual activity: Not on file  Other Topics Concern  . Not on file  Social History Narrative   Work or School: Chief Financial Officer - industrial  Home Situation: lives with wife and son      Spiritual Beliefs: Hindu      Lifestyle: in 2017 dx with diabetes, working on improving diet and exercises    Allergies: No Known Allergies  Medications:  Current Outpatient Medications  Medication Sig Dispense Refill  . sitaGLIPtin-metformin (JANUMET) 50-500 MG tablet Take 1 tablet by mouth 2 (two) times daily with a meal. 180 tablet 1   No current facility-administered medications for this visit.     Review of Systems: Review of Systems  All other systems reviewed and are negative.    PHYSICAL EXAMINATION Blood pressure (!) 151/101, pulse 73, temperature 98.2 F (36.8 C), temperature source Oral, resp. rate 20, height 5' 7"  (1.702 m), weight 178 lb 4.8 oz (80.9 kg), SpO2 100 %.  ECOG PERFORMANCE STATUS: 0 - Asymptomatic  Physical Exam  Constitutional: He is oriented to person, place, and time and well-developed, well-nourished, and in no distress. No distress.  HENT:  Head: Normocephalic and atraumatic.  Mouth/Throat: Oropharynx is clear and moist. No oropharyngeal exudate.  Eyes: Conjunctivae and EOM are normal. Pupils are equal, round, and reactive to light. No scleral icterus.  Cardiovascular: Normal rate, regular rhythm and normal heart sounds. Exam reveals no  friction rub.  No murmur heard. Pulmonary/Chest: Effort normal and breath sounds normal. No respiratory distress. He has no wheezes. He has no rales.  Abdominal: Soft. Bowel sounds are normal. He exhibits no distension and no mass. There is no tenderness. There is no rebound and no guarding.  Musculoskeletal: Normal range of motion. He exhibits no edema.  Lymphadenopathy:       Head (right side): No submental, no submandibular and no occipital adenopathy present.       Head (left side): No submental, no submandibular and no occipital adenopathy present.    He has no cervical adenopathy.    He has no axillary adenopathy.       Right: No inguinal and no supraclavicular adenopathy present.       Left: No inguinal and no supraclavicular adenopathy present.  Examination of the left inguinal region was limited due to tenderness due to recent procedure.  No immediately appreciable lymphadenopathy noted.  Neurological: He is alert and oriented to person, place, and time. He has normal reflexes. No cranial nerve deficit.  Skin: Skin is warm and dry. No rash noted. He is not diaphoretic. No erythema. No pallor.     LABORATORY DATA: I have personally reviewed the data as listed: Appointment on 06/20/2017  Component Date Value Ref Range Status  . WBC 06/20/2017 6.1  4.0 - 10.3 10e3/uL Final  . NEUT# 06/20/2017 3.8  1.5 - 6.5 10e3/uL Final  . HGB 06/20/2017 16.9  13.0 - 17.1 g/dL Final  . HCT 06/20/2017 49.7  38.4 - 49.9 % Final  . Platelets 06/20/2017 261  140 - 400 10e3/uL Final  . MCV 06/20/2017 82.3  79.3 - 98.0 fL Final  . MCH 06/20/2017 28.0  27.2 - 33.4 pg Final  . MCHC 06/20/2017 34.0  32.0 - 36.0 g/dL Final  . RBC 06/20/2017 6.04* 4.20 - 5.82 10e6/uL Final  . RDW 06/20/2017 12.9  11.0 - 14.6 % Final  . lymph# 06/20/2017 1.8  0.9 - 3.3 10e3/uL Final  . MONO# 06/20/2017 0.3  0.1 - 0.9 10e3/uL Final  . Eosinophils Absolute 06/20/2017 0.2  0.0 - 0.5 10e3/uL Final  . Basophils Absolute  06/20/2017 0.0  0.0 - 0.1 10e3/uL Final  . NEUT% 06/20/2017 61.8  39.0 - 75.0 % Final  . LYMPH% 06/20/2017 29.8  14.0 - 49.0 % Final  . MONO% 06/20/2017 5.2  0.0 - 14.0 % Final  . EOS% 06/20/2017 2.5  0.0 - 7.0 % Final  . BASO% 06/20/2017 0.7  0.0 - 2.0 % Final  . nRBC 06/20/2017 0  0 - 0 % Final  . Sodium 06/20/2017 140  136 - 145 mEq/L Final  . Potassium 06/20/2017 4.7  3.5 - 5.1 mEq/L Final  . Chloride 06/20/2017 103  98 - 109 mEq/L Final  . CO2 06/20/2017 27  22 - 29 mEq/L Final  . Glucose 06/20/2017 100  70 - 140 mg/dl Final   Glucose reference range is for nonfasting patients. Fasting glucose reference range is 70- 100.  Marland Kitchen BUN 06/20/2017 8.3  7.0 - 26.0 mg/dL Final  . Creatinine 06/20/2017 0.9  0.7 - 1.3 mg/dL Final  . Total Bilirubin 06/20/2017 0.67  0.20 - 1.20 mg/dL Final  . Alkaline Phosphatase 06/20/2017 55  40 - 150 U/L Final  . AST 06/20/2017 19  5 - 34 U/L Final  . ALT 06/20/2017 22  0 - 55 U/L Final  . Total Protein 06/20/2017 7.5  6.4 - 8.3 g/dL Final  . Albumin 06/20/2017 4.3  3.5 - 5.0 g/dL Final  . Calcium 06/20/2017 9.9  8.4 - 10.4 mg/dL Final  . Anion Gap 06/20/2017 10  3 - 11 mEq/L Final  . EGFR 06/20/2017 >60  >60 ml/min/1.73 m2 Final   eGFR is calculated using the CKD-EPI Creatinine Equation (2009)  . LDH 06/20/2017 137  125 - 245 U/L Final  . Sedimentation Rate-Westergren 06/20/2017 8  0 - 15 mm/hr Final  . CRP 06/20/2017 5.0* 0.0 - 4.9 mg/L Final         Ardath Sax, MD

## 2017-06-30 NOTE — Assessment & Plan Note (Signed)
41 y.o. male presenting with large right inguinal/upper femoral mass that turned out to be a localized lymphadenopathy with florid lymphoid hyperplasia.  This may represent a reactive change due to an irritant or infectious agent exposure versus precursor to development of a lymphoproliferative process.  First step would be to assess for possible presence of pathological lymphadenopathy in any other areas of the body.  If none are present, observation is adequate at this time.  Plan: --Labs today as below --CT N/C/A/P --Return to clinic in 1 week after the CT scan to review the findings

## 2017-07-04 ENCOUNTER — Encounter: Payer: Self-pay | Admitting: Hematology and Oncology

## 2017-07-04 ENCOUNTER — Ambulatory Visit (HOSPITAL_BASED_OUTPATIENT_CLINIC_OR_DEPARTMENT_OTHER): Payer: BLUE CROSS/BLUE SHIELD | Admitting: Hematology and Oncology

## 2017-07-04 ENCOUNTER — Telehealth: Payer: Self-pay | Admitting: Hematology and Oncology

## 2017-07-04 VITALS — BP 150/99 | HR 84 | Temp 98.3°F | Resp 18 | Ht 67.0 in | Wt 193.1 lb

## 2017-07-04 DIAGNOSIS — R590 Localized enlarged lymph nodes: Secondary | ICD-10-CM

## 2017-07-04 DIAGNOSIS — E119 Type 2 diabetes mellitus without complications: Secondary | ICD-10-CM

## 2017-07-04 NOTE — Telephone Encounter (Signed)
Gave avs and calendar for February 2019 °

## 2017-07-11 NOTE — Assessment & Plan Note (Addendum)
41 y.o. male presenting with large right inguinal/upper femoral mass that turned out to be a localized lymphadenopathy with florid lymphoid hyperplasia.  This may represent a reactive change due to an irritant or infectious agent exposure versus precursor to development of a lymphoproliferative process.  Lab work demonstrates no elevation of LDH and no significant hematological abnormalities.  Additional systemic imaging shows no evidence of additional pathological lymphadenopathy at other locations.   Plan: --Observation --Return to clinic in 3 months with lab work and clinical assessment --Repeat CT N/C/A/P in Nov 2019

## 2017-07-11 NOTE — Progress Notes (Signed)
[facility] Follow-up Visit:  Assessment: LAD (lymphadenopathy), inguinal 41 y.o. male presenting with large right inguinal/upper femoral mass that turned out to be a localized lymphadenopathy with florid lymphoid hyperplasia.  This may represent a reactive change due to an irritant or infectious agent exposure versus precursor to development of a lymphoproliferative process.  Lab work demonstrates no elevation of LDH and no significant hematological abnormalities.  Additional systemic imaging shows no evidence of additional pathological lymphadenopathy at other locations.   Plan: --Observation --Return to clinic in 3 months with lab work and clinical assessment --Repeat CT N/C/A/P in Nov 2019  Voice recognition software was used and Agricultural engineer of this note. Despite my best effort at editing the text, some misspelling/errors may have occurred.  Orders Placed This Encounter  Procedures  . CBC with Differential    Standing Status:   Future    Standing Expiration Date:   07/04/2018  . Lactate dehydrogenase (LDH)    Standing Status:   Future    Standing Expiration Date:   07/04/2018    All questions were answered.  . The patient knows to call the clinic with any problems, questions or concerns.  This note was electronically signed.    History of Presenting Illness Martin Sanchez is a 41 y.o. male followed in the [facility] for diagnosis of florid lymphoid hyperplasia referred by Dr Donnie Mesa.  Patient initially presented to attention of his caregivers after incidentally discovering a mass in the proximal right thigh medially.  He denies having any injury to the areas, he did not have any pain or discomfort, and there were no changes of the overlying skin.  The mass was present for approximately 2 weeks and appeared to have enlarged and patient sought medical care.  Initially, MRI pelvis was obtained on 05/13/17 demonstrating no evidence of pelvic lymphadenopathy,  but the right inguinal lymphadenopathy with at least 2 lymph nodes with the largest one measuring 3.1 x 2.1 x 1.7 cm.  Due to suspicion for lymphoproliferative process, patient underwent an excisional lymph node biopsy on 05/24/17.Lymph node tissue demonstrated preserved architecture of the lymph node with florid follicular hyperplasia with reactive-appearing germinal centers.  No appreciable atypical cells.  No granuloma presence.  Flow cytometry obtained based on the tissue demonstrated no evidence of monoclonal lymphoid proliferation.  Returns to the clinic to review lab work and additional imaging.  Patient denies any fevers, chills, night sweats.  No enlargement of lymph nodes in other areas of the body.  Denies respiratory, gastrointestinal, or genitourinary complaints.  No muscle or joint pains.  No significant weight loss or decreased ability of tolerating activities of daily living.  Oncological/hematological History: --Labs, 06/20/17: WBC 6.1, Hgb 16.9, Plt 261; LDH 137, CRP 5.0, ESR 8 --CT C/A/P, 06/27/17: Multiple cervical lymph nodes without pathologic lymphadenopathy.  No mediastinal, hilar, supraclavicular, axillary, retroperitoneal lymphadenopathy.  Stable borderline right inguinal and right external iliac lymphadenopathy.  Medical History: Past Medical History:  Diagnosis Date  . BMI 28.0-28.9,adult 06/11/2016  . Essential hypertension 06/11/2016  . Hyperlipemia 03/08/2016  . Type 2 diabetes mellitus without complication, without long-term current use of insulin ([place]) 03/08/2016    Surgical History: Past Surgical History:  Procedure Laterality Date  . LYMPH NODE BIOPSY Right 05/13/2017    Family History: Family History  Problem Relation Age of Onset  . Diabetes Father     Social History: Social History   Socioeconomic History  . Marital status: Married    Spouse name: Not  on file  . Number of children: Not on file  . Years of education: Not on file  . Highest  education level: Not on file  Social Needs  . Financial resource strain: Not on file  . Food insecurity - worry: Not on file  . Food insecurity - inability: Not on file  . Transportation needs - medical: Not on file  . Transportation needs - non-medical: Not on file  Occupational History  . Not on file  Tobacco Use  . Smoking status: Never Smoker  . Smokeless tobacco: Never Used  Substance and Sexual Activity  . Alcohol use: No  . Drug use: No  . Sexual activity: Not on file  Other Topics Concern  . 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 sitaGLIPtin-metformin (JANUMET) 50-500 MG tablet Take 1 tablet by mouth 2 (two) times daily with a meal. 180 tablet 1   No current facility-administered medications for this visit.     Review of Systems: Review of Systems  All other systems reviewed and are negative.    PHYSICAL EXAMINATION Blood pressure (!) 150/99, pulse 84, temperature 98.3 F (36.8 C), temperature source Oral, resp. rate 18, height '5\' 7"'$  (1.702 m), weight 193 lb 1.6 oz (87.6 kg), SpO2 100 %.  ECOG PERFORMANCE STATUS: 0 - Asymptomatic  Physical Exam  Constitutional: He is oriented to person, place, and time and well-developed, well-nourished, and in no distress. No distress.  HENT:  Head: Normocephalic and atraumatic.  Mouth/Throat: Oropharynx is clear and moist. No oropharyngeal exudate.  Eyes: Conjunctivae and EOM are normal. Pupils are equal, round, and reactive to light. No scleral icterus.  Cardiovascular: Normal rate, regular rhythm and normal heart sounds. Exam reveals no friction rub.  No murmur heard. Pulmonary/Chest: Effort normal and breath  sounds normal. No respiratory distress. He has no wheezes. He has no rales.  Abdominal: Soft. Bowel sounds are normal. He exhibits no distension and no mass. There is no tenderness. There is no rebound and no guarding.  Musculoskeletal: Normal range of motion. He exhibits no edema.  Lymphadenopathy:       Head (right side): No submental, no submandibular and no occipital adenopathy present.       Head (left side): No submental, no submandibular and no occipital adenopathy present.    He has no cervical adenopathy.    He has no axillary adenopathy.       Right: No inguinal and no supraclavicular adenopathy present.       Left: No inguinal and no supraclavicular adenopathy present.  Examination of the left inguinal region was limited due to tenderness due to recent procedure.  No immediately appreciable lymphadenopathy noted.  Neurological: He is alert and oriented to person, place, and time. He has normal reflexes. No cranial nerve deficit.  Skin: Skin is warm and dry. No rash noted. He is not diaphoretic. No erythema. No pallor.     LABORATORY DATA: I have personally reviewed the data as listed: No visits with results within 1 Week(s) from this visit.  Latest known visit with results is:  Appointment on 06/20/2017  Component Date Value Ref  Range Status  . WBC 06/20/2017 6.1  4.0 - 10.3 10e3/uL Final  . NEUT# 06/20/2017 3.8  1.5 - 6.5 10e3/uL Final  . HGB 06/20/2017 16.9  13.0 - 17.1 g/dL Final  . HCT 06/20/2017 49.7  38.4 - 49.9 % Final  . Platelets 06/20/2017 261  140 - 400 10e3/uL Final  . MCV 06/20/2017 82.3  79.3 - 98.0 fL Final  . MCH 06/20/2017 28.0  27.2 - 33.4 pg Final  . MCHC 06/20/2017 34.0  32.0 - 36.0 g/dL Final  . RBC 06/20/2017 6.04* 4.20 - 5.82 10e6/uL Final  . RDW 06/20/2017 12.9  11.0 - 14.6 % Final  . lymph# 06/20/2017 1.8  0.9 - 3.3 10e3/uL Final  . MONO# 06/20/2017 0.3  0.1 - 0.9 10e3/uL Final  . Eosinophils Absolute 06/20/2017 0.2  0.0 - 0.5 10e3/uL Final  .  Basophils Absolute 06/20/2017 0.0  0.0 - 0.1 10e3/uL Final  . NEUT% 06/20/2017 61.8  39.0 - 75.0 % Final  . LYMPH% 06/20/2017 29.8  14.0 - 49.0 % Final  . MONO% 06/20/2017 5.2  0.0 - 14.0 % Final  . EOS% 06/20/2017 2.5  0.0 - 7.0 % Final  . BASO% 06/20/2017 0.7  0.0 - 2.0 % Final  . nRBC 06/20/2017 0  0 - 0 % Final  . Sodium 06/20/2017 140  136 - 145 mEq/L Final  . Potassium 06/20/2017 4.7  3.5 - 5.1 mEq/L Final  . Chloride 06/20/2017 103  98 - 109 mEq/L Final  . CO2 06/20/2017 27  22 - 29 mEq/L Final  . Glucose 06/20/2017 100  70 - 140 mg/dl Final   Glucose reference range is for nonfasting patients. Fasting glucose reference range is 70- 100.  Marland Kitchen BUN 06/20/2017 8.3  7.0 - 26.0 mg/dL Final  . Creatinine 06/20/2017 0.9  0.7 - 1.3 mg/dL Final  . Total Bilirubin 06/20/2017 0.67  0.20 - 1.20 mg/dL Final  . Alkaline Phosphatase 06/20/2017 55  40 - 150 U/L Final  . AST 06/20/2017 19  5 - 34 U/L Final  . ALT 06/20/2017 22  0 - 55 U/L Final  . Total Protein 06/20/2017 7.5  6.4 - 8.3 g/dL Final  . Albumin 06/20/2017 4.3  3.5 - 5.0 g/dL Final  . Calcium 06/20/2017 9.9  8.4 - 10.4 mg/dL Final  . Anion Gap 06/20/2017 10  3 - 11 mEq/L Final  . EGFR 06/20/2017 >60  >60 ml/min/1.73 m2 Final   eGFR is calculated using the CKD-EPI Creatinine Equation (2009)  . LDH 06/20/2017 137  125 - 245 U/L Final  . Sedimentation Rate-Westergren 06/20/2017 8  0 - 15 mm/hr Final  . CRP 06/20/2017 5.0* 0.0 - 4.9 mg/L Final       Ardath Sax, MD

## 2017-08-02 ENCOUNTER — Other Ambulatory Visit: Payer: Self-pay | Admitting: Family Medicine

## 2017-08-02 NOTE — Telephone Encounter (Signed)
Medication filled to pharmacy as requested.   

## 2017-08-25 NOTE — Progress Notes (Signed)
HPI:  Here for CPE and follow up: Labs, BP med, statin, eye exam, foot exam, flu shot He refused pneumonia vaccine  -Concerns and/or follow up today:   DM: -complications: none known -dx in 2017 in [place] with neg optho exam and EKG at the time -initial hgba1c 10.9 (6/17) -->6.1! Mostly with lifestyle changes -meds: janumet -refused statin  HLD: -he wanted to do lifestyle changes - see above -is against taking a cholesterol medication - refused to take statin -denies hx statin intolerance  Elevated Blood Pressure : -he did not want to do blood pressure medication - finally agreed to losartan then he stopped taking it -denies: CP, SOB, DOE, HA, vision changes  Elevated liver enzymes: -in [place] -resolved off alcohol  LAD: -R inguinal -seeing oncology for management -florid lymphoid hyperplasia on biopsy in 2018 -per oncology notes neg labs and systemic imaging  -they are observing for now with 3 month follow up  -Diet: variety of foods, balance and well rounded, larger portion sizes -Exercise: no regular exercise -Diabetes and Dyslipidemia Screening: -Hx of HTN: no -Vaccines: UTD -sexual activity: yes, male partner, no new partners -wants STI testing, Hep C screening (if born 19-1966): no -FH colon or prstate ca: see FH Last colon cancer screening: n/a  Last prostate ca screening:n/a -Alcohol, Tobacco, drug use: see social history  Review of Systems - no fevers, unintentional weight loss, vision loss, hearing loss, chest pain, sob, hemoptysis, melena, hematochezia, hematuria, genital discharge, changing or concerning skin lesions, bleeding, bruising, loc, thoughts of self harm or SI  Past Medical History:  Diagnosis Date  . BMI 28.0-28.9,adult 06/11/2016  . Essential hypertension 06/11/2016  . Hyperlipemia 03/08/2016  . Type 2 diabetes mellitus without complication, without long-term current use of insulin ([place]) 03/08/2016    Past Surgical History:    Procedure Laterality Date  . LYMPH NODE BIOPSY Right 05/13/2017    Family History  Problem Relation Age of Onset  . Diabetes Father     Social History   Socioeconomic History  . Marital status: Married    Spouse name: None  . Number of children: None  . Years of education: None  . Highest education level: None  Social Needs  . Financial resource strain: None  . Food insecurity - worry: None  . Food insecurity - inability: None  . Transportation needs - medical: None  . Transportation needs - non-medical: None  Occupational History  . None  Tobacco Use  . Smoking status: Never Smoker  . Smokeless tobacco: Never Used  Substance and Sexual Activity  . Alcohol use: No  . Drug use: No  . Sexual activity: None  Other Topics Concern  . 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  JANUMET 50-500 MG tablet, TAKE 1 TABLET TWICE A DAY WITH MEALS, Disp: 180 tablet, Rfl: 0  EXAM:  Vitals:   08/26/17 0811  BP: 124/88  Pulse: 82  Temp: 98.2 F (36.8 C)  TempSrc: Oral  Weight: 182 lb 11.2 oz (82.9 kg)  Height: 5' 7.75" (1.721 m)    Estimated body mass index is 27.98 kg/m as calculated from the following:   Height as of this encounter: 5' 7.75" (1.721 m).  Weight as of this encounter: 182 lb 11.2 oz (82.9 kg).  GENERAL: vitals reviewed and listed below, alert, oriented, appears well hydrated and in no acute distress  HEENT: head atraumatic, PERRLA, normal appearance of eyes, ears, nose and mouth. moist mucus membranes.  NECK: supple, no masses or lymphadenopathy  LUNGS: clear to auscultation bilaterally, no rales, rhonchi or wheeze  CV: HRRR, no peripheral edema or cyanosis, normal pedal pulses  ABDOMEN: bowel sounds normal, soft, non  tender to palpation, no masses, no rebound or guarding  GU: deferre  SKIN: no rash or abnormal lesions  MS: normal gait, moves all extremities normally  NEURO: normal gait, speech and thought processing grossly intact, muscle tone grossly intact throughout  PSYCH: normal affect, pleasant and cooperative  ASSESSMENT AND PLAN:  Discussed the following assessment and plan:  PREVENTIVE EXAM: -Discussed and advised all [place] preventive services health task force level A and B recommendations for age, sex and risks. -Advised at least 150 minutes of exercise per week and a healthy diet with avoidance of (less then 1 serving per week) processed foods, white starches, red meat, fast foods and sweets and consisting of: * 5-9 servings of fresh fruits and vegetables (not corn or potatoes) *nuts and seeds, beans *olives and olive oil *lean meats such as fish and white chicken  *whole grains -labs, studies and vaccines per orders this encounter  1. Screening for depression -negative  2. Type 2 diabetes mellitus without complication, without long-term current use of insulin ([place]) -discussed implications, management -advised eye exam and numbers provided for options to schedule -advised statin and BP management -labs per orders -foot exam done  3. Hypertension associated with diabetes ([place]) -discussed risks, he reports runs 140s on average -advise treatment and discussed options -lifestyle and meds -he agreed to try losartan after discussion risks/benefit -follow up 1 month  4. Hyperlipidemia associated with type 2 diabetes mellitus ([place]) -he finally agreed to try low dose crestor after discussion risks benefits  5. Encounter for preventive health examination See above   Patient Instructions  BEFORE YOU LEAVE: -flu shot -PPSV23 -labs -follow up: 1 month for recheck BP  Start the Crestor '5mg'$  and the Losartan '50mg'$  and take once daily.  Call the eye doctor today to schedule your  diabetic eye exam.  We have ordered labs or studies at this visit. It can take up to 1-2 weeks for results and processing. IF results require follow up or explanation, we will call you with instructions. Clinically stable results will be released to your [facility]. If you have not heard from [place] or cannot find your results in [facility] in 2 weeks please contact our office at [phone].  If you are not yet signed up for [facility], please consider signing up.   We recommend the following healthy lifestyle for LIFE: 1) Small portions. But, make sure to get regular (at least 3 per day), healthy meals and small healthy snacks if needed.  2) Eat a healthy clean diet.   TRY TO EAT: -at least 5-7 servings of low sugar, colorful, and nutrient rich vegetables per day (not corn, potatoes or bananas.) -berries are the best choice if you wish to eat fruit (only eat small amounts if trying to reduce weight)  -lean meets (fish, white meat of chicken or [place]) -vegan proteins for some meals - beans or tofu, whole grains, nuts and seeds -Replace bad fats with good fats - good fats include: fish, nuts and seeds, canola oil, olive oil -small  amounts of low fat or non fat dairy -small amounts of100 % whole grains - check the lables -drink plenty of water  AVOID: -SUGAR, sweets, anything with added sugar, corn syrup or sweeteners - must read labels as even foods advertised as "healthy" often are loaded with sugar -if you must have a sweetener, small amounts of stevia may be best -sweetened beverages and artificially sweetened beverages -simple starches (rice, bread, potatoes, pasta, chips, etc - small amounts of 100% whole grains are ok) -red meat, pork, butter -fried foods, fast food, processed food, excessive dairy, eggs and coconut.  3)Get at least 150 minutes of sweaty aerobic exercise per week.  4)Reduce stress - consider counseling, meditation and relaxation to balance other aspects of your  life.   Preventive Care 40-64 Years, Male Preventive care refers to lifestyle choices and visits with your health care provider that can promote health and wellness. What does preventive care include?  A yearly physical exam. This is also called an annual well check.  Dental exams once or twice a year.  Routine eye exams. Ask your health care provider how often you should have your eyes checked.  Personal lifestyle choices, including: ? Daily care of your teeth and gums. ? Regular physical activity. ? Eating a healthy diet. ? Avoiding tobacco and drug use. ? Limiting alcohol use. ? Practicing safe sex. ? Taking low-dose aspirin every day starting at age 12. What happens during an annual well check? The services and screenings done by your health care provider during your annual well check will depend on your age, overall health, lifestyle risk factors, and family history of disease. Counseling Your health care provider may ask you questions about your:  Alcohol use.  Tobacco use.  Drug use.  Emotional well-being.  Home and relationship well-being.  Sexual activity.  Eating habits.  Work and work Statistician.  Screening You may have the following tests or measurements:  Height, weight, and BMI.  Blood pressure.  Lipid and cholesterol levels. These may be checked every 5 years, or more frequently if you are over 75 years old.  Skin check.  Lung cancer screening. You may have this screening every year starting at age 43 if you have a 30-pack-year history of smoking and currently smoke or have quit within the past 15 years.  Fecal occult blood test (FOBT) of the stool. You may have this test every year starting at age 49.  Flexible sigmoidoscopy or colonoscopy. You may have a sigmoidoscopy every 5 years or a colonoscopy every 10 years starting at age 69.  Prostate cancer screening. Recommendations will vary depending on your family history and other  risks.  Hepatitis C blood test.  Hepatitis B blood test.  Sexually transmitted disease (STD) testing.  Diabetes screening. This is done by checking your blood sugar (glucose) after you have not eaten for a while (fasting). You may have this done every 1-3 years.  Discuss your test results, treatment options, and if necessary, the need for more tests with your health care provider. Vaccines Your health care provider may recommend certain vaccines, such as:  Influenza vaccine. This is recommended every year.  Tetanus, diphtheria, and acellular pertussis (Tdap, Td) vaccine. You may need a Td booster every 10 years.  Varicella vaccine. You may need this if you have not been vaccinated.  Zoster vaccine. You may need this after age 58.  Measles, mumps, and rubella (MMR) vaccine. You may need at least one dose of MMR if you were  born in [birth year] or later. You may also need a second dose.  Pneumococcal 13-valent conjugate (PCV13) vaccine. You may need this if you have certain conditions and have not been vaccinated.  Pneumococcal polysaccharide (PPSV23) vaccine. You may need one or two doses if you smoke cigarettes or if you have certain conditions.  Meningococcal vaccine. You may need this if you have certain conditions.  Hepatitis A vaccine. You may need this if you have certain conditions or if you travel or work in places where you may be exposed to hepatitis A.  Hepatitis B vaccine. You may need this if you have certain conditions or if you travel or work in places where you may be exposed to hepatitis B.  Haemophilus influenzae type b (Hib) vaccine. You may need this if you have certain risk factors.  Talk to your health care provider about which screenings and vaccines you need and how often you need them. This information is not intended to replace advice given to you by your health care provider. Make sure you discuss any questions you have with your health care provider. Document  Released: 09/05/2015 Document Revised: 04/28/2016 Document Reviewed: 06/10/2015 Elsevier Interactive Patient Education  2018 [employer].          No Follow-up on file.   Colin Benton R., DO

## 2017-08-26 ENCOUNTER — Encounter: Payer: Self-pay | Admitting: Family Medicine

## 2017-08-26 ENCOUNTER — Ambulatory Visit (INDEPENDENT_AMBULATORY_CARE_PROVIDER_SITE_OTHER): Payer: BLUE CROSS/BLUE SHIELD | Admitting: Family Medicine

## 2017-08-26 VITALS — BP 124/88 | HR 82 | Temp 98.2°F | Ht 67.75 in | Wt 182.7 lb

## 2017-08-26 DIAGNOSIS — Z1331 Encounter for screening for depression: Secondary | ICD-10-CM

## 2017-08-26 DIAGNOSIS — E1169 Type 2 diabetes mellitus with other specified complication: Secondary | ICD-10-CM | POA: Diagnosis not present

## 2017-08-26 DIAGNOSIS — I152 Hypertension secondary to endocrine disorders: Secondary | ICD-10-CM

## 2017-08-26 DIAGNOSIS — Z23 Encounter for immunization: Secondary | ICD-10-CM | POA: Diagnosis not present

## 2017-08-26 DIAGNOSIS — Z0000 Encounter for general adult medical examination without abnormal findings: Secondary | ICD-10-CM

## 2017-08-26 DIAGNOSIS — E1159 Type 2 diabetes mellitus with other circulatory complications: Secondary | ICD-10-CM

## 2017-08-26 DIAGNOSIS — I10 Essential (primary) hypertension: Secondary | ICD-10-CM | POA: Diagnosis not present

## 2017-08-26 DIAGNOSIS — E785 Hyperlipidemia, unspecified: Secondary | ICD-10-CM

## 2017-08-26 DIAGNOSIS — E119 Type 2 diabetes mellitus without complications: Secondary | ICD-10-CM

## 2017-08-26 LAB — LIPID PANEL
CHOL/HDL RATIO: 5
Cholesterol: 214 mg/dL — ABNORMAL HIGH (ref 0–200)
HDL: 44 mg/dL (ref >39.00)
LDL CALC: 142 mg/dL — AB (ref 0–99)
NONHDL: 169.54
Triglycerides: 139 mg/dL (ref 0.0–149.0)
VLDL: 27.8 mg/dL (ref 0.0–40.0)

## 2017-08-26 LAB — CBC
HEMATOCRIT: 47.7 % (ref 39.0–52.0)
HEMOGLOBIN: 16.1 g/dL (ref 13.0–17.0)
MCHC: 33.7 g/dL (ref 30.0–36.0)
MCV: 83.4 fl (ref 78.0–100.0)
PLATELETS: 295 10*3/uL (ref 150.0–400.0)
RBC: 5.71 Mil/uL (ref 4.22–5.81)
RDW: 13.9 % (ref 11.5–15.5)
WBC: 7.2 10*3/uL (ref 4.0–10.5)

## 2017-08-26 LAB — BASIC METABOLIC PANEL
BUN: 13 mg/dL (ref 6–23)
CHLORIDE: 99 meq/L (ref 96–112)
CO2: 30 meq/L (ref 19–32)
CREATININE: 0.93 mg/dL (ref 0.40–1.50)
Calcium: 9.7 mg/dL (ref 8.4–10.5)
GFR: 94.42 mL/min (ref >60.00)
Glucose, Bld: 99 mg/dL (ref 70–99)
Potassium: 4.5 mEq/L (ref 3.5–5.1)
SODIUM: 139 meq/L (ref 135–145)

## 2017-08-26 LAB — HEMOGLOBIN A1C: Hgb A1c MFr Bld: 5.9 % (ref 4.6–6.5)

## 2017-08-26 MED ORDER — LOSARTAN POTASSIUM 50 MG PO TABS: 50.0000 mg | ORAL_TABLET | Freq: Every day | ORAL | 3 refills | Status: DC

## 2017-08-26 MED ORDER — ROSUVASTATIN CALCIUM 5 MG PO TABS: 5.0000 mg | ORAL_TABLET | Freq: Every day | ORAL | 3 refills | Status: DC

## 2017-08-26 NOTE — Patient Instructions (Addendum)
BEFORE YOU LEAVE: -flu shot -PPSV23 -labs -follow up: 1 month for recheck BP  Start the Crestor 103m and the Losartan 55mand take once daily.  Call the eye doctor today to schedule your diabetic eye exam.  We have ordered labs or studies at this visit. It can take up to 1-2 weeks for results and processing. IF results require follow up or explanation, we will call you with instructions. Clinically stable results will be released to your [facility] you have not heard from usKorear cannot find your results in [facility] 2 weeks please contact our office at [phone] If you are not yet signed up for [facility] consider signing up.   We recommend the following healthy lifestyle for LIFE: 1) Small portions. But, make sure to get regular (at least 3 per day), healthy meals and small healthy snacks if needed.  2) Eat a healthy clean diet.   TRY TO EAT: -at least 5-7 servings of low sugar, colorful, and nutrient rich vegetables per day (not corn, potatoes or bananas.) -berries are the best choice if you wish to eat fruit (only eat small amounts if trying to reduce weight)  -lean meets (fish, white meat of chicken or [place]-vegan proteins for some meals - beans or tofu, whole grains, nuts and seeds -Replace bad fats with good fats - good fats include: fish, nuts and seeds, canola oil, olive oil -small amounts of low fat or non fat dairy -small amounts of100 % whole grains - check the lables -drink plenty of water  AVOID: -SUGAR, sweets, anything with added sugar, corn syrup or sweeteners - must read labels as even foods advertised as "healthy" often are loaded with sugar -if you must have a sweetener, small amounts of stevia may be best -sweetened beverages and artificially sweetened beverages -simple starches (rice, bread, potatoes, pasta, chips, etc - small amounts of 100% whole grains are ok) -red meat, pork, butter -fried foods, fast food, processed food, excessive dairy,  eggs and coconut.  3)Get at least 150 minutes of sweaty aerobic exercise per week.  4)Reduce stress - consider counseling, meditation and relaxation to balance other aspects of your life.   Preventive Care 40-64 Years, Male Preventive care refers to lifestyle choices and visits with your health care provider that can promote health and wellness. What does preventive care include?  A yearly physical exam. This is also called an annual well check.  Dental exams once or twice a year.  Routine eye exams. Ask your health care provider how often you should have your eyes checked.  Personal lifestyle choices, including: ? Daily care of your teeth and gums. ? Regular physical activity. ? Eating a healthy diet. ? Avoiding tobacco and drug use. ? Limiting alcohol use. ? Practicing safe sex. ? Taking low-dose aspirin every day starting at age 6827What happens during an annual well check? The services and screenings done by your health care provider during your annual well check will depend on your age, overall health, lifestyle risk factors, and family history of disease. Counseling Your health care provider may ask you questions about your:  Alcohol use.  Tobacco use.  Drug use.  Emotional well-being.  Home and relationship well-being.  Sexual activity.  Eating habits.  Work and work enStatistician Screening You may have the following tests or measurements:  Height, weight, and BMI.  Blood pressure.  Lipid and cholesterol levels. These may be checked every 5 years, or more frequently if you  are over 57 years old.  Skin check.  Lung cancer screening. You may have this screening every year starting at age 108 if you have a 30-pack-year history of smoking and currently smoke or have quit within the past 15 years.  Fecal occult blood test (FOBT) of the stool. You may have this test every year starting at age 53.  Flexible sigmoidoscopy or colonoscopy. You may have a  sigmoidoscopy every 5 years or a colonoscopy every 10 years starting at age 71.  Prostate cancer screening. Recommendations will vary depending on your family history and other risks.  Hepatitis C blood test.  Hepatitis B blood test.  Sexually transmitted disease (STD) testing.  Diabetes screening. This is done by checking your blood sugar (glucose) after you have not eaten for a while (fasting). You may have this done every 1-3 years.  Discuss your test results, treatment options, and if necessary, the need for more tests with your health care provider. Vaccines Your health care provider may recommend certain vaccines, such as:  Influenza vaccine. This is recommended every year.  Tetanus, diphtheria, and acellular pertussis (Tdap, Td) vaccine. You may need a Td booster every 10 years.  Varicella vaccine. You may need this if you have not been vaccinated.  Zoster vaccine. You may need this after age 97.  Measles, mumps, and rubella (MMR) vaccine. You may need at least one dose of MMR if you were born in 1957 or later. You may also need a second dose.  Pneumococcal 13-valent conjugate (PCV13) vaccine. You may need this if you have certain conditions and have not been vaccinated.  Pneumococcal polysaccharide (PPSV23) vaccine. You may need one or two doses if you smoke cigarettes or if you have certain conditions.  Meningococcal vaccine. You may need this if you have certain conditions.  Hepatitis A vaccine. You may need this if you have certain conditions or if you travel or work in places where you may be exposed to hepatitis A.  Hepatitis B vaccine. You may need this if you have certain conditions or if you travel or work in places where you may be exposed to hepatitis B.  Haemophilus influenzae type b (Hib) vaccine. You may need this if you have certain risk factors.  Talk to your health care provider about which screenings and vaccines you need and how often you need  them. This information is not intended to replace advice given to you by your health care provider. Make sure you discuss any questions you have with your health care provider. Document Released: 09/05/2015 Document Revised: 04/28/2016 Document Reviewed: 06/10/2015 Elsevier Interactive Patient Education  [employer].

## 2017-08-26 NOTE — Progress Notes (Signed)
Patient declined Pneumococcal 23 vaccine today.

## 2017-08-26 NOTE — Addendum Note (Signed)
Addended by: Johnella MoloneyFUNDERBURK, Jayah Balthazar A on: 08/26/2017 09:06 AM   Modules accepted: Orders

## 2017-09-01 ENCOUNTER — Encounter: Payer: Self-pay | Admitting: Family Medicine

## 2017-09-02 ENCOUNTER — Telehealth: Payer: Self-pay | Admitting: *Deleted

## 2017-09-02 NOTE — Telephone Encounter (Signed)
See results note from 1/10.   Copied from CRM [phone]. Topic: General - Call Back - No Documentation >> Aug 31, 2017  1:49 PM Viviann SpareWhite, Selina wrote: Reason for CRM: Patient called and stated he was returning Dr. Selena BattenKim assistant call from yesterday. Did not see any documentation of the call, he is requesting a call back @ [phone].

## 2017-09-13 DIAGNOSIS — M47812 Spondylosis without myelopathy or radiculopathy, cervical region: Secondary | ICD-10-CM | POA: Diagnosis not present

## 2017-09-13 DIAGNOSIS — M9901 Segmental and somatic dysfunction of cervical region: Secondary | ICD-10-CM | POA: Diagnosis not present

## 2017-09-13 DIAGNOSIS — M25531 Pain in right wrist: Secondary | ICD-10-CM | POA: Diagnosis not present

## 2017-09-13 DIAGNOSIS — M542 Cervicalgia: Secondary | ICD-10-CM | POA: Diagnosis not present

## 2017-10-02 NOTE — Progress Notes (Signed)
HPI:  Martin Sanchez is a pleasant 42 y.o. here for follow up. Chronic medical problems summarized below were reviewed for changes and stability and were updated as needed below. These issues and their treatment remain stable for the most part.  We finally agreed to take a statin and started losartan back up at his last visit.  Reports he has been taking the medications daily and tolerates these well.  He has been checking his blood pressure at home and it runs in the 120s over 80s.  It was elevated at his oncology appointment today.  He has been traveling a lot.lifts uitcase up and has some soreness in the right extensor tendon attachments to the elbow for about 1 week.  Denies any weakness, numbness, radiation or known trauma.  Denies CP, SOB, DOE, treatment intolerance or new symptoms.  DM: -complications: none known -dx in 2017 in [place] with neg optho exam and EKG at the time -initial hgba1c 10.9 (6/17) -->5.9 08/2017! 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  Past Medical History:  Diagnosis Date  . BMI 28.0-28.9,adult 06/11/2016  . Essential hypertension 06/11/2016  . Hyperlipemia 03/08/2016  . Type 2 diabetes mellitus without complication, without  long-term current use of insulin (HCC) 03/08/2016    Past Surgical History:  Procedure Laterality Date  . LYMPH NODE BIOPSY Right 05/13/2017    Family History  Problem Relation Age of Onset  . Diabetes Father     Social History   Socioeconomic History  . Marital status: Married    Spouse name: None  . Number of children: None  . Years of education: None  . Highest education level: None  Social Needs  . Financial resource strain: None  . Food insecurity - worry: None  . Food insecurity - inability: None  . Transportation needs - medical: None  . Transportation needs - non-medical: None  Occupational History  . None  Tobacco Use  . Smoking status: Never Smoker  . Smokeless tobacco: Never Used  Substance and Sexual Activity  . Alcohol use: No  . Drug use: No  . Sexual activity: None  Other Topics Concern  . None  Social History Narrative   Work or School: Art gallery manager - Therapist, sports Situation: lives with wife and son      Spiritual Beliefs: Hindu      Lifestyle: in 2017 dx with diabetes, working on improving diet and exercises     Current Outpatient Medications:  .  JANUMET 50-500 MG tablet, TAKE 1 TABLET TWICE A DAY WITH MEALS, Disp: 180 tablet, Rfl: 0 .  losartan (COZAAR) 50 MG tablet, Take 1.5 tablets (75 mg total) by mouth daily., Disp: 135 tablet, Rfl: 3 .  rosuvastatin (CRESTOR) 5 MG tablet, Take 1 tablet (5 mg  total) by mouth daily., Disp: 90 tablet, Rfl: 3  EXAM:  Vitals:   10/03/17 1100  BP: 136/88  Pulse: 82  Temp: 97.8 F (36.6 C)    Body mass index is 27.95 kg/m.  GENERAL: vitals reviewed and listed above, alert, oriented, appears well hydrated and in no acute distress  HEENT: atraumatic, conjunttiva clear, no obvious abnormalities on inspection of external nose and ears  NECK: no obvious masses on inspection  LUNGS: clear to auscultation bilaterally, no wheezes, rales or rhonchi, good air movement  CV: HRRR, no peripheral  edema  MS: moves all extremities without noticeable abnormality, normal inspection of both arms without any weakness or loss of sensitivity to light touch, he does have some tenderness to palpation in the extensor tendons attachment of the elbow in the right arm, this is worsened with activation of these muscles, neurovascularly intact distally  PSYCH: pleasant and cooperative, no obvious depression or anxiety  ASSESSMENT AND PLAN:  Discussed the following assessment and plan:  Pain of right upper extremity  Hypertension associated with diabetes (HCC)  Hyperlipidemia associated with type 2 diabetes mellitus (HCC)  -Discussed potential etiologies for the arm pain and suspect extensor tendinopathy -opted to treat with conservative symptomatic care and home exercises -Opted to increase his blood pressure medication a little, did advise him of recent recalls and advised that he check with his pharmacy to ensure that his drug is not impacted -He is tolerating the medicines well -Lifestyle recommendations -Follow-up in 3 months, 1 month of the arm pain persists -Patient advised to return or notify a doctor immediately if symptoms worsen or persist or new concerns arise.  Patient Instructions  BEFORE YOU LEAVE: -Home exercises tennis elbow -follow up: Follow-up in 3 months  For the arm pain: -Do the exercises 4 days/week -Heat once to twice per day -Topical sports creams with menthol can be helpful for the discomfort  Increase the losartan to 75 mg once daily in the morning.  Please check with your pharmacy about the recall on these medications.  Continue your other medications.   We recommend the following healthy lifestyle for LIFE: 1) Small portions. But, make sure to get regular (at least 3 per day), healthy meals and small healthy snacks if needed.  2) Eat a healthy clean diet.   TRY TO EAT: -at least 5-7 servings of low sugar, colorful, and nutrient rich vegetables per day  (not corn, potatoes or bananas.) -berries are the best choice if you wish to eat fruit (only eat small amounts if trying to reduce weight)  -lean meets (fish, white meat of chicken or [place]) -vegan proteins for some meals - beans or tofu, whole grains, nuts and seeds -Replace bad fats with good fats - good fats include: fish, nuts and seeds, canola oil, olive oil -small amounts of low fat or non fat dairy -small amounts of100 % whole grains - check the lables -drink plenty of water  AVOID: -SUGAR, sweets, anything with added sugar, corn syrup or sweeteners - must read labels as even foods advertised as "healthy" often are loaded with sugar -if you must have a sweetener, small amounts of stevia may be best -sweetened beverages and artificially sweetened beverages -simple starches (rice, bread, potatoes, pasta, chips, etc - small amounts of 100% whole grains are ok) -red meat, pork, butter -fried foods, fast food, processed food, excessive dairy, eggs and coconut.  3)Get at least 150 minutes of sweaty aerobic exercise per week.  4)Reduce stress - consider  counseling, meditation and relaxation to balance other aspects of your life.    Terressa Koyanagi, DO

## 2017-10-03 ENCOUNTER — Inpatient Hospital Stay: Payer: BLUE CROSS/BLUE SHIELD

## 2017-10-03 ENCOUNTER — Ambulatory Visit (INDEPENDENT_AMBULATORY_CARE_PROVIDER_SITE_OTHER): Payer: BLUE CROSS/BLUE SHIELD | Admitting: Family Medicine

## 2017-10-03 ENCOUNTER — Encounter: Payer: Self-pay | Admitting: Hematology and Oncology

## 2017-10-03 ENCOUNTER — Telehealth: Payer: Self-pay | Admitting: Hematology and Oncology

## 2017-10-03 ENCOUNTER — Inpatient Hospital Stay: Payer: BLUE CROSS/BLUE SHIELD | Attending: Hematology and Oncology | Admitting: Hematology and Oncology

## 2017-10-03 ENCOUNTER — Encounter: Payer: Self-pay | Admitting: Family Medicine

## 2017-10-03 VITALS — BP 136/88 | HR 82 | Temp 97.8°F | Ht 67.75 in | Wt 182.5 lb

## 2017-10-03 VITALS — BP 145/99 | HR 79 | Temp 98.0°F | Resp 20 | Ht 67.75 in | Wt 180.8 lb

## 2017-10-03 DIAGNOSIS — E119 Type 2 diabetes mellitus without complications: Secondary | ICD-10-CM | POA: Insufficient documentation

## 2017-10-03 DIAGNOSIS — I152 Hypertension secondary to endocrine disorders: Secondary | ICD-10-CM

## 2017-10-03 DIAGNOSIS — E785 Hyperlipidemia, unspecified: Secondary | ICD-10-CM | POA: Diagnosis not present

## 2017-10-03 DIAGNOSIS — E1169 Type 2 diabetes mellitus with other specified complication: Secondary | ICD-10-CM | POA: Diagnosis not present

## 2017-10-03 DIAGNOSIS — R591 Generalized enlarged lymph nodes: Secondary | ICD-10-CM | POA: Diagnosis not present

## 2017-10-03 DIAGNOSIS — E1159 Type 2 diabetes mellitus with other circulatory complications: Secondary | ICD-10-CM | POA: Diagnosis not present

## 2017-10-03 DIAGNOSIS — R590 Localized enlarged lymph nodes: Secondary | ICD-10-CM

## 2017-10-03 DIAGNOSIS — I10 Essential (primary) hypertension: Secondary | ICD-10-CM | POA: Insufficient documentation

## 2017-10-03 DIAGNOSIS — M79601 Pain in right arm: Secondary | ICD-10-CM

## 2017-10-03 LAB — CBC WITH DIFFERENTIAL/PLATELET
BASOS ABS: 0 10*3/uL (ref 0.0–0.1)
Basophils Relative: 1 %
Eosinophils Absolute: 0.2 10*3/uL (ref 0.0–0.5)
Eosinophils Relative: 4 %
HEMATOCRIT: 48.8 % (ref 38.4–49.9)
HEMOGLOBIN: 16.5 g/dL (ref 13.0–17.1)
LYMPHS ABS: 2.2 10*3/uL (ref 0.9–3.3)
LYMPHS PCT: 35 %
MCH: 27.9 pg (ref 27.2–33.4)
MCHC: 33.8 g/dL (ref 32.0–36.0)
MCV: 82.6 fL (ref 79.3–98.0)
MONO ABS: 0.4 10*3/uL (ref 0.1–0.9)
Monocytes Relative: 6 %
NEUTROS ABS: 3.4 10*3/uL (ref 1.5–6.5)
Neutrophils Relative %: 54 %
Platelets: 239 10*3/uL (ref 140–400)
RBC: 5.91 MIL/uL — ABNORMAL HIGH (ref 4.20–5.82)
RDW: 13.1 % (ref 11.0–14.6)
WBC: 6.3 10*3/uL (ref 4.0–10.3)
nRBC: 0 /100 WBC

## 2017-10-03 LAB — LACTATE DEHYDROGENASE: LDH: 147 U/L (ref 125–245)

## 2017-10-03 MED ORDER — LOSARTAN POTASSIUM 50 MG PO TABS: 75.0000 mg | ORAL_TABLET | Freq: Every day | ORAL | 3 refills | Status: DC

## 2017-10-03 NOTE — Telephone Encounter (Signed)
Gave patient avs and calendar with appts per 2/11 los.  °

## 2017-10-03 NOTE — Patient Instructions (Signed)
BEFORE YOU LEAVE: -Home exercises tennis elbow -follow up: Follow-up in 3 months  For the arm pain: -Do the exercises 4 days/week -Heat once to twice per day -Topical sports creams with menthol can be helpful for the discomfort  Increase the losartan to 75 mg once daily in the morning.  Please check with your pharmacy about the recall on these medications.  Continue your other medications.   We recommend the following healthy lifestyle for LIFE: 1) Small portions. But, make sure to get regular (at least 3 per day), healthy meals and small healthy snacks if needed.  2) Eat a healthy clean diet.   TRY TO EAT: -at least 5-7 servings of low sugar, colorful, and nutrient rich vegetables per day (not corn, potatoes or bananas.) -berries are the best choice if you wish to eat fruit (only eat small amounts if trying to reduce weight)  -lean meets (fish, white meat of chicken or [place]) -vegan proteins for some meals - beans or tofu, whole grains, nuts and seeds -Replace bad fats with good fats - good fats include: fish, nuts and seeds, canola oil, olive oil -small amounts of low fat or non fat dairy -small amounts of100 % whole grains - check the lables -drink plenty of water  AVOID: -SUGAR, sweets, anything with added sugar, corn syrup or sweeteners - must read labels as even foods advertised as "healthy" often are loaded with sugar -if you must have a sweetener, small amounts of stevia may be best -sweetened beverages and artificially sweetened beverages -simple starches (rice, bread, potatoes, pasta, chips, etc - small amounts of 100% whole grains are ok) -red meat, pork, butter -fried foods, fast food, processed food, excessive dairy, eggs and coconut.  3)Get at least 150 minutes of sweaty aerobic exercise per week.  4)Reduce stress - consider counseling, meditation and relaxation to balance other aspects of your life.

## 2017-10-24 NOTE — Progress Notes (Signed)
[facility] Follow-up Visit:  Assessment: LAD (lymphadenopathy), inguinal 42 y.o. male presenting with large right inguinal/upper femoral mass that turned out to be a localized lymphadenopathy with florid lymphoid hyperplasia.  This may represent a reactive change due to an irritant or infectious agent exposure versus precursor to development of a lymphoproliferative process.  Lab work demonstrates no elevation of LDH and no significant hematological abnormalities.  Additional systemic imaging shows no evidence of additional pathological lymphadenopathy at other locations.  Repeat lab work reveals no progressive hematological abnormalities and no elevation of LDH.  Over the past 3 months, lymphadenopathy appears to have resolved more consistent with reactive lymphadenopathy down with a progressive hematological malignancy.   Plan: -Continue observation. -Return to clinic in 3 months with lab work and clinical assessment -Repeat CT N/C/A/P in Nov 2019  Voice recognition software was used and Agricultural engineer of this note. Despite my best effort at editing the text, some misspelling/errors may have occurred.  Orders Placed This Encounter  Procedures  . CBC with Differential (Cancer Center Only)    Standing Status:   Future    Standing Expiration Date:   10/03/2018  . CMP ([place] only)    Standing Status:   Future    Standing Expiration Date:   10/03/2018  . Lactate dehydrogenase (LDH)    Standing Status:   Future    Standing Expiration Date:   10/03/2018    All questions were answered.  . The patient knows to call the clinic with any problems, questions or concerns.  This note was electronically signed.    History of Presenting Illness Martin Sanchez is a 42 y.o. male followed in the [facility] for diagnosis of florid lymphoid hyperplasia referred by Dr Donnie Mesa.  Patient initially presented to attention of his caregivers after incidentally discovering a mass  in the proximal right thigh medially.  He denies having any injury to the areas, he did not have any pain or discomfort, and there were no changes of the overlying skin.  The mass was present for approximately 2 weeks and appeared to have enlarged and patient sought medical care.  Initially, MRI pelvis was obtained on 05/13/17 demonstrating no evidence of pelvic lymphadenopathy, but the right inguinal lymphadenopathy with at least 2 lymph nodes with the largest one measuring 3.1 x 2.1 x 1.7 cm.  Due to suspicion for lymphoproliferative process, patient underwent an excisional lymph node biopsy on 05/24/17.Lymph node tissue demonstrated preserved architecture of the lymph node with florid follicular hyperplasia with reactive-appearing germinal centers.  No appreciable atypical cells.  No granuloma presence.  Flow cytometry obtained based on the tissue demonstrated no evidence of monoclonal lymphoid proliferation.  Patient returns to the clinic for clinical monitoring.  Patient denies any interval new complaints.  No fevers, chills, night sweats.  No new areas of swelling in the neck, armpits, or groin.  Interval resolution of the previous swelling right inguinal region.  No swelling in the lower extremities.  Oncological/hematological History: --Labs, 06/20/17: WBC 6.1, Hgb 16.9, Plt 261; LDH 137, CRP 5.0, ESR 8 --CT C/A/P, 06/27/17: Multiple cervical lymph nodes without pathologic lymphadenopathy.  No mediastinal, hilar, supraclavicular, axillary, retroperitoneal lymphadenopathy.  Stable borderline right inguinal and right external iliac lymphadenopathy. --Labs, 10/03/17: WBC 6.3, Hgb 16.5, Plt 239; LDH 147  Medical History: Past Medical History:  Diagnosis Date  . BMI 28.0-28.9,adult 06/11/2016  . Essential hypertension 06/11/2016  . Hyperlipemia 03/08/2016  . Type 2 diabetes mellitus without complication, without long-term  current use of insulin ([place]) 03/08/2016    Surgical History: Past  Surgical History:  Procedure Laterality Date  . LYMPH NODE BIOPSY Right 05/13/2017    Family History: Family History  Problem Relation Age of Onset  . Diabetes Father     Social History: Social History   Socioeconomic History  . Marital status: Married    Spouse name: Not on file  . Number of children: Not on file  . Years of education: Not on file  . Highest education level: Not on file  Social Needs  . Financial resource strain: Not on file  . Food insecurity - worry: Not on file  . Food insecurity - inability: Not on file  . Transportation needs - medical: Not on file  . Transportation needs - non-medical: Not on file  Occupational History  . Not on file  Tobacco Use  . Smoking status: Never Smoker  . Smokeless tobacco: Never Used  Substance and Sexual Activity  . Alcohol use: No  . Drug use: No  . Sexual activity: Not on file  Other Topics Concern  . 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 JANUMET 50-500 MG tablet TAKE 1 TABLET TWICE A DAY WITH MEALS 180 tablet 0  . losartan (COZAAR) 50 MG tablet Take 1.5 tablets (75 mg total) by mouth daily. 135 tablet 3  . rosuvastatin (CRESTOR) 5 MG tablet Take 1 tablet (5 mg total) by mouth daily. 90 tablet 3   No current facility-administered medications for this visit.     Review of Systems: Review of Systems  All other systems reviewed and are negative.    PHYSICAL EXAMINATION Blood pressure (!) 145/99, pulse 79, temperature 98 F (36.7 C), temperature source Oral, resp. rate 20, height 5' 7.75" (1.721 m), weight 180 lb 12.8 oz (82 kg), SpO2 100 %.  ECOG PERFORMANCE STATUS: 0 -  Asymptomatic  Physical Exam  Constitutional: He is oriented to person, place, and time and well-developed, well-nourished, and in no distress. No distress.  HENT:  Head: Normocephalic and atraumatic.  Mouth/Throat: Oropharynx is clear and moist. No oropharyngeal exudate.  Eyes: Conjunctivae and EOM are normal. Pupils are equal, round, and reactive to light. No scleral icterus.  Cardiovascular: Normal rate, regular rhythm and normal heart sounds. Exam reveals no friction rub.  No murmur heard. Pulmonary/Chest: Effort normal and breath sounds normal. No respiratory distress. He has no wheezes. He has no rales.  Abdominal: Soft. Bowel sounds are normal. He exhibits no distension and no mass. There is no tenderness. There is no rebound and no guarding.  Musculoskeletal: Normal range of motion. He exhibits no edema.  Lymphadenopathy:       Head (right side): No submental, no submandibular and no occipital adenopathy present.       Head (left side): No submental, no submandibular and no occipital adenopathy present.    He has no cervical adenopathy.    He has no axillary adenopathy.       Right: No inguinal and no supraclavicular adenopathy present.       Left: No inguinal and no supraclavicular adenopathy present.  Neurological:  He is alert and oriented to person, place, and time. He has normal reflexes. No cranial nerve deficit.  Skin: Skin is warm and dry. No rash noted. He is not diaphoretic. No erythema. No pallor.     LABORATORY DATA: I have personally reviewed the data as listed: Appointment on 10/03/2017  Component Date Value Ref Range Status  . LDH 10/03/2017 147  125 - 245 U/L Final   Performed at [facility], [place] [address], [place], [place] [address]  . WBC 10/03/2017 6.3  4.0 - 10.3 K/uL Final  . RBC 10/03/2017 5.91* 4.20 - 5.82 MIL/uL Final  . Hemoglobin 10/03/2017 16.5  13.0 - 17.1 g/dL Final  . HCT 10/03/2017 48.8  38.4 - 49.9 % Final  . MCV  10/03/2017 82.6  79.3 - 98.0 fL Final  . MCH 10/03/2017 27.9  27.2 - 33.4 pg Final  . MCHC 10/03/2017 33.8  32.0 - 36.0 g/dL Final  . RDW 10/03/2017 13.1  11.0 - 14.6 % Final  . Platelets 10/03/2017 239  140 - 400 K/uL Final  . Neutrophils Relative % 10/03/2017 54  % Final  . Neutro Abs 10/03/2017 3.4  1.5 - 6.5 K/uL Final  . Lymphocytes Relative 10/03/2017 35  % Final  . Lymphs Abs 10/03/2017 2.2  0.9 - 3.3 K/uL Final  . Monocytes Relative 10/03/2017 6  % Final  . Monocytes Absolute 10/03/2017 0.4  0.1 - 0.9 K/uL Final  . Eosinophils Relative 10/03/2017 4  % Final  . Eosinophils Absolute 10/03/2017 0.2  0.0 - 0.5 K/uL Final  . Basophils Relative 10/03/2017 1  % Final  . Basophils Absolute 10/03/2017 0.0  0.0 - 0.1 K/uL Final  . nRBC 10/03/2017 0  0 /100 WBC Final   Performed at [facility], [place] [address] [place], [place] [address]       Ardath Sax, MD

## 2017-10-24 NOTE — Assessment & Plan Note (Signed)
42 y.o. male presenting with large right inguinal/upper femoral mass that turned out to be a localized lymphadenopathy with florid lymphoid hyperplasia.  This may represent a reactive change due to an irritant or infectious agent exposure versus precursor to development of a lymphoproliferative process.  Lab work demonstrates no elevation of LDH and no significant hematological abnormalities.  Additional systemic imaging shows no evidence of additional pathological lymphadenopathy at other locations.  Repeat lab work reveals no progressive hematological abnormalities and no elevation of LDH.  Over the past 3 months, lymphadenopathy appears to have resolved more consistent with reactive lymphadenopathy down with a progressive hematological malignancy.   Plan: -Continue observation. -Return to clinic in 3 months with lab work and clinical assessment -Repeat CT N/C/A/P in Nov 2019

## 2017-12-14 DIAGNOSIS — M542 Cervicalgia: Secondary | ICD-10-CM | POA: Diagnosis not present

## 2017-12-15 DIAGNOSIS — M542 Cervicalgia: Secondary | ICD-10-CM | POA: Diagnosis not present

## 2017-12-23 DIAGNOSIS — M542 Cervicalgia: Secondary | ICD-10-CM | POA: Diagnosis not present

## 2018-01-01 NOTE — Progress Notes (Signed)
HPI:  Using dictation device. Unfortunately this device frequently misinterprets words/phrases.  Martin Sanchez is a pleasant 42 y.o. here for follow up. Chronic medical problems summarized below were reviewed for changes.  Reports diet has not been is good.  Does get 10,000 steps a day, but not exercising as much.  Reports he has been taking his medications daily.  Reports his blood pressure is usually okay on recheck, but is up when he gets to offices.  Had a cold last week, and his oncologist thought his labs were up from that.  Had labs with his oncologist today.. Denies CP, SOB, DOE, treatment intolerance or new symptoms.  DM: -complications: none known -dx in 2017 in [place] with neg optho exam and EKG at the time -initial hgba1c 10.9 (6/17) -->5.9 08/2017! Mostly with lifestyle changes -meds: janumet -refused statin  HLD: -he wanted to do lifestyle changes - see above -is against taking a cholesterol medication -refused to take statin initially, then started Crestor low dose 5 mg 08/2017 -denies hx statin intolerance  Elevated BloodPressure: -he did not want to do blood pressure medication - finally agreed to [place] he stopped taking it, restarted 08/2017 -denies: CP, SOB, DOE, HA, vision changes  Elevated liver enzymes: -in [place] -resolved off alcohol  LAD: -R inguinal -seeing oncology for management -florid lymphoid hyperplasia on biopsy in 2018 -per oncology notes neg labs and systemic imaging  -they are observing for now with 3 month follow up     ROS: See pertinent positives and negatives per HPI.  Past Medical History:  Diagnosis Date  . BMI 28.0-28.9,adult 06/11/2016  . Essential hypertension 06/11/2016  . Hyperlipemia 03/08/2016  . Type 2 diabetes mellitus without complication, without long-term current use of insulin ([facility]) 03/08/2016    Past Surgical History:  Procedure Laterality Date  . LYMPH NODE BIOPSY Right 05/13/2017    Family History   Problem Relation Age of Onset  . Diabetes Father     SOCIAL HX: See HPI   Current Outpatient Medications:  .  JANUMET 50-500 MG tablet, TAKE 1 TABLET TWICE A DAY WITH MEALS, Disp: 180 tablet, Rfl: 0 .  losartan (COZAAR) 50 MG tablet, Take 1.5 tablets (75 mg total) by mouth daily. (Patient taking differently: Take 50 mg by mouth daily. ), Disp: 135 tablet, Rfl: 3 .  rosuvastatin (CRESTOR) 5 MG tablet, Take 1 tablet (5 mg total) by mouth daily., Disp: 90 tablet, Rfl: 3  EXAM:  Vitals:   01/02/18 1014  BP: 118/82  Pulse: 69  Temp: 97.7 F (36.5 C)  SpO2: 99%    Body mass index is 27.72 kg/m.  GENERAL: vitals reviewed and listed above, alert, oriented, appears well hydrated and in no acute distress  HEENT: atraumatic, conjunttiva clear, no obvious abnormalities on inspection of external nose and ears  NECK: no obvious masses on inspection  LUNGS: clear to auscultation bilaterally, no wheezes, rales or rhonchi, good air movement  CV: HRRR, no peripheral edema  MS: moves all extremities without noticeable abnormality  PSYCH: pleasant and cooperative, no obvious depression or anxiety  ASSESSMENT AND PLAN:  Discussed the following assessment and plan:  Type 2 diabetes mellitus without complication, without long-term current use of insulin ([facility]) - Plan: Hemoglobin A1c  Hyperlipidemia associated with type 2 diabetes mellitus ([facility]) - Plan: Lipid panel  Hypertension associated with diabetes ([facility]CC)  -Encouraged a healthy lifestyle for life- -labs at a fasting lab appointment in 1 week, as he is not fasting currently -He opted to  continue his current medications and work on improving diet, consider changing blood pressure medication if blood pressure not improved at next follow-up -Seeing oncology for management of the lymphadenopathy -Follow-up 3 months, sooner as needed  Patient Instructions  BEFORE YOU LEAVE: -set up fasting lab visit next week -follow up: 3  months  Schedule your diabetic eye exam and bring report to [place].  Work on eating healthy and get at least  150 minutes of aerobic exercise per week.  Continue all medications.  We have ordered labs or studies at this visit. It can take up to 1-2 weeks for results and processing. IF results require follow up or explanation, we will call you with instructions. Clinically stable results will be released to your [facility]. If you have not heard from [place] or cannot find your results in [facility] in 2 weeks please contact our office at [phone].  If you are not yet signed up for [facility], please consider signing up.           Terressa Koyanagi, DO

## 2018-01-02 ENCOUNTER — Encounter: Payer: Self-pay | Admitting: Hematology and Oncology

## 2018-01-02 ENCOUNTER — Inpatient Hospital Stay: Payer: BLUE CROSS/BLUE SHIELD | Attending: Hematology and Oncology | Admitting: Hematology and Oncology

## 2018-01-02 ENCOUNTER — Telehealth: Payer: Self-pay | Admitting: Hematology and Oncology

## 2018-01-02 ENCOUNTER — Encounter: Payer: Self-pay | Admitting: Family Medicine

## 2018-01-02 ENCOUNTER — Inpatient Hospital Stay: Payer: BLUE CROSS/BLUE SHIELD

## 2018-01-02 ENCOUNTER — Ambulatory Visit (INDEPENDENT_AMBULATORY_CARE_PROVIDER_SITE_OTHER): Payer: BLUE CROSS/BLUE SHIELD | Admitting: Family Medicine

## 2018-01-02 VITALS — BP 118/82 | HR 69 | Temp 97.7°F | Ht 67.75 in | Wt 181.0 lb

## 2018-01-02 VITALS — BP 133/100 | HR 76 | Temp 98.0°F | Resp 17 | Ht 67.75 in | Wt 182.5 lb

## 2018-01-02 DIAGNOSIS — R590 Localized enlarged lymph nodes: Secondary | ICD-10-CM

## 2018-01-02 DIAGNOSIS — R591 Generalized enlarged lymph nodes: Secondary | ICD-10-CM | POA: Diagnosis not present

## 2018-01-02 DIAGNOSIS — I10 Essential (primary) hypertension: Secondary | ICD-10-CM

## 2018-01-02 DIAGNOSIS — E1169 Type 2 diabetes mellitus with other specified complication: Secondary | ICD-10-CM

## 2018-01-02 DIAGNOSIS — E785 Hyperlipidemia, unspecified: Secondary | ICD-10-CM

## 2018-01-02 DIAGNOSIS — E1159 Type 2 diabetes mellitus with other circulatory complications: Secondary | ICD-10-CM

## 2018-01-02 DIAGNOSIS — I152 Hypertension secondary to endocrine disorders: Secondary | ICD-10-CM

## 2018-01-02 DIAGNOSIS — E119 Type 2 diabetes mellitus without complications: Secondary | ICD-10-CM

## 2018-01-02 LAB — CBC WITH DIFFERENTIAL (CANCER CENTER ONLY)
Basophils Absolute: 0.1 10*3/uL (ref 0.0–0.1)
Basophils Relative: 1 %
EOS PCT: 6 %
Eosinophils Absolute: 0.4 10*3/uL (ref 0.0–0.5)
HCT: 46.8 % (ref 38.4–49.9)
HEMOGLOBIN: 15.6 g/dL (ref 13.0–17.1)
LYMPHS ABS: 2.4 10*3/uL (ref 0.9–3.3)
Lymphocytes Relative: 33 %
MCH: 27.4 pg (ref 27.2–33.4)
MCHC: 33.4 g/dL (ref 32.0–36.0)
MCV: 81.9 fL (ref 79.3–98.0)
MONOS PCT: 6 %
Monocytes Absolute: 0.4 10*3/uL (ref 0.1–0.9)
NEUTROS PCT: 54 %
Neutro Abs: 3.9 10*3/uL (ref 1.5–6.5)
Platelet Count: 268 10*3/uL (ref 140–400)
RBC: 5.71 MIL/uL (ref 4.20–5.82)
RDW: 13.6 % (ref 11.0–14.6)
WBC Count: 7.1 10*3/uL (ref 4.0–10.3)

## 2018-01-02 LAB — CMP (CANCER CENTER ONLY)
ALK PHOS: 55 U/L (ref 40–150)
ALT: 29 U/L (ref 0–55)
ANION GAP: 8 (ref 3–11)
AST: 20 U/L (ref 5–34)
Albumin: 4.1 g/dL (ref 3.5–5.0)
BUN: 12 mg/dL (ref 7–26)
CO2: 27 mmol/L (ref 22–29)
Calcium: 9.6 mg/dL (ref 8.4–10.4)
Chloride: 106 mmol/L (ref 98–109)
Creatinine: 0.97 mg/dL (ref 0.70–1.30)
Glucose, Bld: 129 mg/dL (ref 70–140)
Potassium: 4.7 mmol/L (ref 3.5–5.1)
SODIUM: 141 mmol/L (ref 136–145)
TOTAL PROTEIN: 7.1 g/dL (ref 6.4–8.3)
Total Bilirubin: 0.6 mg/dL (ref 0.2–1.2)

## 2018-01-02 LAB — LACTATE DEHYDROGENASE: LDH: 160 U/L (ref 125–245)

## 2018-01-02 NOTE — Patient Instructions (Signed)
BEFORE YOU LEAVE: -set up fasting lab visit next week -follow up: 3 months  Schedule your diabetic eye exam and bring report to [place].  Work on eating healthy and get at least  150 minutes of aerobic exercise per week.  Continue all medications.  We have ordered labs or studies at this visit. It can take up to 1-2 weeks for results and processing. IF results require follow up or explanation, we will call you with instructions. Clinically stable results will be released to your [facility]. If you have not heard from [place] or cannot find your results in [facility] in 2 weeks please contact our office at [phone].  If you are not yet signed up for [facility], please consider signing up.

## 2018-01-02 NOTE — Telephone Encounter (Signed)
Appointments scheduled AVS/Calendar printed per 5/13 los °

## 2018-01-06 ENCOUNTER — Telehealth: Payer: Self-pay | Admitting: Family Medicine

## 2018-01-06 ENCOUNTER — Other Ambulatory Visit: Payer: Self-pay | Admitting: *Deleted

## 2018-01-06 MED ORDER — SITAGLIPTIN-METFORMIN HCL 50-500 MG PO TABS
1.0000 | ORAL_TABLET | Freq: Two times a day (BID) | ORAL | 1 refills | Status: DC
Start: 2018-01-06 — End: 2018-08-03

## 2018-01-06 MED ORDER — LOSARTAN POTASSIUM 50 MG PO TABS: 75.0000 mg | ORAL_TABLET | Freq: Every day | ORAL | 1 refills | Status: DC

## 2018-01-06 NOTE — Telephone Encounter (Unsigned)
Copied from CRM [id number]. Topic: Quick Communication - Rx Refill/Question >> Jan 06, 2018  2:29 PM Floria Raveling A wrote: Medication:  JANUMET 50-500 MG tablet [[id number]]  losartan (COZAAR) 50 MG tablet [[id number]] Has the patient contacted their pharmacy? No  (Agent: If no, request that the patient contact the pharmacy for the refill.) (Agent: If yes, when and what did the pharmacy advise [facility]erred [facility] (with phone number [facility]   Agent: Please be advised that RX refills may take up to 3 business days. We ask that you follow-up with your pharmacy.

## 2018-01-06 NOTE — Telephone Encounter (Signed)
Rx refilled per protocol. LOV: 01/02/18 with f/u due 8/19

## 2018-01-10 ENCOUNTER — Telehealth: Payer: Self-pay | Admitting: Family Medicine

## 2018-01-10 NOTE — Telephone Encounter (Signed)
Copied from CRM [id number]. Topic: Quick Communication - Rx Refill/Question >> Jan 10, 2018  5:35 PM Louie Bun, Rosey Bath D wrote: Medication: rosuvastatin (CRESTOR) 5 MG tablet  Has the patient contacted their pharmacy? Yes (Agent: If no, request that the patient contact the pharmacy for the refill.) (Agent: If yes, when and what did the pharmacy advise?)  Preferred Pharmacy (with phone number or street name): [facility] - [place], [place] - [address]  Agent: Please be advised that RX refills may take up to 3 business days. We ask that you follow-up with your pharmacy.

## 2018-01-11 DIAGNOSIS — M542 Cervicalgia: Secondary | ICD-10-CM | POA: Diagnosis not present

## 2018-01-11 MED ORDER — ROSUVASTATIN CALCIUM 5 MG PO TABS: 5.0000 mg | ORAL_TABLET | Freq: Every day | ORAL | 0 refills | Status: DC

## 2018-01-13 ENCOUNTER — Other Ambulatory Visit (INDEPENDENT_AMBULATORY_CARE_PROVIDER_SITE_OTHER): Payer: BLUE CROSS/BLUE SHIELD

## 2018-01-13 DIAGNOSIS — R590 Localized enlarged lymph nodes: Secondary | ICD-10-CM

## 2018-01-13 DIAGNOSIS — E785 Hyperlipidemia, unspecified: Secondary | ICD-10-CM | POA: Diagnosis not present

## 2018-01-13 DIAGNOSIS — E119 Type 2 diabetes mellitus without complications: Secondary | ICD-10-CM | POA: Diagnosis not present

## 2018-01-13 DIAGNOSIS — E1169 Type 2 diabetes mellitus with other specified complication: Secondary | ICD-10-CM | POA: Diagnosis not present

## 2018-01-13 LAB — LDL CHOLESTEROL, DIRECT: Direct LDL: 81 mg/dL

## 2018-01-13 LAB — LIPID PANEL
CHOLESTEROL: 147 mg/dL (ref 0–200)
HDL: 41.1 mg/dL (ref >39.00)
NonHDL: 105.93
TRIGLYCERIDES: 221 mg/dL — AB (ref 0.0–149.0)
Total CHOL/HDL Ratio: 4
VLDL: 44.2 mg/dL — ABNORMAL HIGH (ref 0.0–40.0)

## 2018-01-13 LAB — HEMOGLOBIN A1C: HEMOGLOBIN A1C: 6.1 % (ref 4.6–6.5)

## 2018-01-18 NOTE — Assessment & Plan Note (Signed)
42 y.o. male presenting with large right inguinal/upper femoral mass that turned out to be a localized lymphadenopathy with florid lymphoid hyperplasia.  This may represent a reactive change due to an irritant or infectious agent exposure versus precursor to development of a lymphoproliferative process.  Lab work demonstrates no elevation of LDH and no significant hematological abnormalities.  Additional systemic imaging shows no evidence of additional pathological lymphadenopathy at other locations.  Repeat lab work reveals no progressive hematological abnormalities and no elevation of LDH.    Plan: -Continue observation. -Return to clinic in 3 months with lab work and clinical assessment -Repeat CT N/C/A/P in Nov 2019, if negative, will discharge from hematological follow-up at that time.

## 2018-01-18 NOTE — Progress Notes (Signed)
[facility] Follow-up Visit:  Assessment: LAD (lymphadenopathy), inguinal 42 y.o. male presenting with large right inguinal/upper femoral mass that turned out to be a localized lymphadenopathy with florid lymphoid hyperplasia.  This may represent a reactive change due to an irritant or infectious agent exposure versus precursor to development of a lymphoproliferative process.  Lab work demonstrates no elevation of LDH and no significant hematological abnormalities.  Additional systemic imaging shows no evidence of additional pathological lymphadenopathy at other locations.  Repeat lab work reveals no progressive hematological abnormalities and no elevation of LDH.    Plan: -Continue observation. -Return to clinic in 3 months with lab work and clinical assessment -Repeat CT N/C/A/P in Nov 2019, if negative, will discharge from hematological follow-up at that time.  Voice recognition software was used and creation of this note. Despite my best effort at editing the text, some misspelling/errors may have occurred.  Orders Placed This Encounter  Procedures  . CBC with Differential (Cancer Center Only)    Standing Status:   Future    Number of Occurrences:   1    Standing Expiration Date:   01/03/2019    All questions were answered.  . The patient knows to call the clinic with any problems, questions or concerns.  This note was electronically signed.    History of Presenting Illness Martin Sanchez is a 42 y.o. male followed in the [place] for diagnosis of florid lymphoid hyperplasia referred by Dr Donnie Mesa.  Patient initially presented to attention of his caregivers after incidentally discovering a mass in the proximal right thigh medially.  He denies having any injury to the areas, he did not have any pain or discomfort, and there were no changes of the overlying skin.  The mass was present for approximately 2 weeks and appeared to have enlarged and patient  sought medical care.  Initially, MRI pelvis was obtained on 05/13/17 demonstrating no evidence of pelvic lymphadenopathy, but the right inguinal lymphadenopathy with at least 2 lymph nodes with the largest one measuring 3.1 x 2.1 x 1.7 cm.  Due to suspicion for lymphoproliferative process, patient underwent an excisional lymph node biopsy on 05/24/17.Lymph node tissue demonstrated preserved architecture of the lymph node with florid follicular hyperplasia with reactive-appearing germinal centers.  No appreciable atypical cells.  No granuloma presence.  Flow cytometry obtained based on the tissue demonstrated no evidence of monoclonal lymphoid proliferation.  Patient returns to the clinic for clinical monitoring.  Patient denies any interval new complaints.  No fevers, chills, night sweats.  No new areas of swelling in the neck, armpits, or groin.  No recurrent swelling right inguinal region.  No swelling in the lower extremities.  Oncological/hematological History: --Labs, 06/20/17: WBC 6.1, Hgb 16.9, Plt 261; LDH 137, CRP 5.0, ESR 8 --CT C/A/P, 06/27/17: Multiple cervical lymph nodes without pathologic lymphadenopathy.  No mediastinal, hilar, supraclavicular, axillary, retroperitoneal lymphadenopathy.  Stable borderline right inguinal and right external iliac lymphadenopathy. --Labs, 10/03/17: WBC 6.3, Hgb 16.5, Plt 239; LDH 147 --Labs, 01/02/18: WBC 7.1, Hgb 15.6, Plt 268  Medical History: Past Medical History:  Diagnosis Date  . BMI 28.0-28.9,adult 06/11/2016  . Essential hypertension 06/11/2016  . Hyperlipemia 03/08/2016  . Type 2 diabetes mellitus without complication, without long-term current use of insulin ([place]) 03/08/2016    Surgical History: Past Surgical History:  Procedure Laterality Date  . LYMPH NODE BIOPSY Right 05/13/2017    Family History: Family History  Problem Relation Age of Onset  . Diabetes Father  Social History: Social History   Socioeconomic History  .  Marital status: Married    Spouse name: Not on file  . Number of children: Not on file  . Years of education: Not on file  . Highest education level: Not on file  Occupational History  . Not on file  Social Needs  . Financial resource strain: Not on file  . Food insecurity:    Worry: Not on file    Inability: Not on file  . Transportation needs:    Medical: Not on file    Non-medical: Not on file  Tobacco Use  . Smoking status: Never Smoker  . Smokeless tobacco: Never Used  Substance and Sexual Activity  . Alcohol use: No  . Drug use: No  . Sexual activity: Not on file  Lifestyle  . Physical activity:    Days per week: Not on file    Minutes per session: Not on file  . Stress: Not on file  Relationships  . Social connections:    Talks on phone: Not on file    Gets together: Not on file    Attends religious service: Not on file    Active member of club or organization: Not on file    Attends meetings of clubs or organizations: Not on file    Relationship status: Not on file  . Intimate partner violence:    Fear of current or ex partner: Not on file    Emotionally abused: Not on file    Physically abused: Not on file    Forced sexual activity: Not on file  Other Topics Concern  . 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 losartan (COZAAR) 50 MG tablet Take 1.5 tablets (75 mg total) by mouth daily. 135 tablet 1  . rosuvastatin (CRESTOR) 5 MG tablet Take 1 tablet (5 mg total) by mouth daily. 90 tablet 0  . sitaGLIPtin-metformin (JANUMET) 50-500 MG tablet Take 1 tablet by mouth 2 (two) times daily with a meal. 180 tablet 1   No current facility-administered  medications for this visit.     Review of Systems: Review of Systems  All other systems reviewed and are negative.    PHYSICAL EXAMINATION Blood pressure (!) 133/100, pulse 76, temperature 98 F (36.7 C), temperature source Oral, resp. rate 17, height 5' 7.75" (1.721 m), weight 182 lb 8 oz (82.8 kg), SpO2 100 %.  ECOG PERFORMANCE STATUS: 0 - Asymptomatic  Physical Exam  Constitutional: He is oriented to person, place, and time and well-developed, well-nourished, and in no distress. No distress.  HENT:  Head: Normocephalic and atraumatic.  Mouth/Throat: Oropharynx is clear and moist. No oropharyngeal exudate.  Eyes: Pupils are equal, round, and reactive to light. Conjunctivae and EOM are normal. No scleral icterus.  Cardiovascular: Normal rate, regular rhythm and normal heart sounds. Exam reveals no friction rub.  No murmur heard. Pulmonary/Chest: Effort normal and breath sounds normal. No respiratory distress. He has no wheezes. He has no rales.  Abdominal: Soft. Bowel sounds are normal. He exhibits no distension and no mass. There is no tenderness. There is no rebound and no  guarding.  Musculoskeletal: Normal range of motion. He exhibits no edema.  Lymphadenopathy:       Head (right side): No submental, no submandibular and no occipital adenopathy present.       Head (left side): No submental, no submandibular and no occipital adenopathy present.    He has no cervical adenopathy.    He has no axillary adenopathy.       Right: No inguinal and no supraclavicular adenopathy present.       Left: No inguinal and no supraclavicular adenopathy present.  Neurological: He is alert and oriented to person, place, and time. He has normal reflexes. No cranial nerve deficit.  Skin: Skin is warm and dry. No rash noted. He is not diaphoretic. No erythema. No pallor.     LABORATORY DATA: I have personally reviewed the data as listed: Appointment on 01/02/2018  Component Date Value Ref Range  Status  . WBC Count 01/02/2018 7.1  4.0 - 10.3 K/uL Final  . RBC 01/02/2018 5.71  4.20 - 5.82 MIL/uL Final  . Hemoglobin 01/02/2018 15.6  13.0 - 17.1 g/dL Final  . HCT 01/02/2018 46.8  38.4 - 49.9 % Final  . MCV 01/02/2018 81.9  79.3 - 98.0 fL Final  . MCH 01/02/2018 27.4  27.2 - 33.4 pg Final  . MCHC 01/02/2018 33.4  32.0 - 36.0 g/dL Final  . RDW 01/02/2018 13.6  11.0 - 14.6 % Final  . Platelet Count 01/02/2018 268  140 - 400 K/uL Final  . Neutrophils Relative % 01/02/2018 54  % Final  . Neutro Abs 01/02/2018 3.9  1.5 - 6.5 K/uL Final  . Lymphocytes Relative 01/02/2018 33  % Final  . Lymphs Abs 01/02/2018 2.4  0.9 - 3.3 K/uL Final  . Monocytes Relative 01/02/2018 6  % Final  . Monocytes Absolute 01/02/2018 0.4  0.1 - 0.9 K/uL Final  . Eosinophils Relative 01/02/2018 6  % Final  . Eosinophils Absolute 01/02/2018 0.4  0.0 - 0.5 K/uL Final  . Basophils Relative 01/02/2018 1  % Final  . Basophils Absolute 01/02/2018 0.1  0.0 - 0.1 K/uL Final   Performed at [facility], [place] [address] [place], [place] [address]  . Sodium 01/02/2018 141  136 - 145 mmol/L Final  . Potassium 01/02/2018 4.7  3.5 - 5.1 mmol/L Final  . Chloride 01/02/2018 106  98 - 109 mmol/L Final  . CO2 01/02/2018 27  22 - 29 mmol/L Final  . Glucose, Bld 01/02/2018 129  70 - 140 mg/dL Final  . BUN 01/02/2018 12  7 - 26 mg/dL Final  . Creatinine 01/02/2018 0.97  0.70 - 1.30 mg/dL Final  . Calcium 01/02/2018 9.6  8.4 - 10.4 mg/dL Final  . Total Protein 01/02/2018 7.1  6.4 - 8.3 g/dL Final  . Albumin 01/02/2018 4.1  3.5 - 5.0 g/dL Final  . AST 01/02/2018 20  5 - 34 U/L Final  . ALT 01/02/2018 29  0 - 55 U/L Final  . Alkaline Phosphatase 01/02/2018 55  40 - 150 U/L Final  . Total Bilirubin 01/02/2018 0.6  0.2 - 1.2 mg/dL Final  . GFR, Est Non Af Am 01/02/2018 >60  >60 mL/min Final  . GFR, Est AFR Am 01/02/2018 >60  >60 mL/min Final   Comment: (NOTE) The eGFR has been calculated using the CKD EPI  equation. This calculation has not been validated in all clinical situations. eGFR's persistently <60 mL/min signify possible Chronic Kidney Disease.   Georgiann Hahn  gap 01/02/2018 8  3 - 11 Final   Performed at [facility], [place] [address], [place], [place] [address]  . LDH 01/02/2018 160  125 - 245 U/L Final   Performed at [facility], [place] [address] [place], [place] [address]       Ardath Sax, MD

## 2018-02-02 DIAGNOSIS — M542 Cervicalgia: Secondary | ICD-10-CM | POA: Diagnosis not present

## 2018-04-06 ENCOUNTER — Other Ambulatory Visit: Payer: Self-pay

## 2018-04-06 DIAGNOSIS — R590 Localized enlarged lymph nodes: Secondary | ICD-10-CM

## 2018-04-07 ENCOUNTER — Other Ambulatory Visit: Payer: Self-pay | Admitting: Family Medicine

## 2018-04-09 ENCOUNTER — Other Ambulatory Visit: Payer: Self-pay | Admitting: Hematology

## 2018-04-09 DIAGNOSIS — R590 Localized enlarged lymph nodes: Secondary | ICD-10-CM

## 2018-04-10 ENCOUNTER — Inpatient Hospital Stay: Payer: BLUE CROSS/BLUE SHIELD | Attending: Hematology and Oncology

## 2018-04-10 ENCOUNTER — Inpatient Hospital Stay (HOSPITAL_BASED_OUTPATIENT_CLINIC_OR_DEPARTMENT_OTHER): Payer: BLUE CROSS/BLUE SHIELD | Admitting: Nurse Practitioner

## 2018-04-10 ENCOUNTER — Telehealth: Payer: Self-pay | Admitting: Hematology

## 2018-04-10 ENCOUNTER — Encounter: Payer: Self-pay | Admitting: Nurse Practitioner

## 2018-04-10 VITALS — BP 164/87 | HR 86 | Temp 98.6°F | Resp 18 | Ht 67.75 in | Wt 184.4 lb

## 2018-04-10 DIAGNOSIS — I10 Essential (primary) hypertension: Secondary | ICD-10-CM

## 2018-04-10 DIAGNOSIS — R599 Enlarged lymph nodes, unspecified: Secondary | ICD-10-CM

## 2018-04-10 DIAGNOSIS — E119 Type 2 diabetes mellitus without complications: Secondary | ICD-10-CM

## 2018-04-10 DIAGNOSIS — Z7984 Long term (current) use of oral hypoglycemic drugs: Secondary | ICD-10-CM | POA: Insufficient documentation

## 2018-04-10 DIAGNOSIS — R590 Localized enlarged lymph nodes: Secondary | ICD-10-CM | POA: Insufficient documentation

## 2018-04-10 DIAGNOSIS — K5900 Constipation, unspecified: Secondary | ICD-10-CM

## 2018-04-10 LAB — CBC WITH DIFFERENTIAL (CANCER CENTER ONLY)
BASOS PCT: 2 %
Basophils Absolute: 0.1 10*3/uL (ref 0.0–0.1)
EOS ABS: 0.4 10*3/uL (ref 0.0–0.5)
Eosinophils Relative: 5 %
HCT: 48.2 % (ref 38.4–49.9)
HEMOGLOBIN: 15.9 g/dL (ref 13.0–17.1)
Lymphocytes Relative: 36 %
Lymphs Abs: 2.4 10*3/uL (ref 0.9–3.3)
MCH: 27.8 pg (ref 27.2–33.4)
MCHC: 33 g/dL (ref 32.0–36.0)
MCV: 84.3 fL (ref 79.3–98.0)
Monocytes Absolute: 0.4 10*3/uL (ref 0.1–0.9)
Monocytes Relative: 7 %
Neutro Abs: 3.4 10*3/uL (ref 1.5–6.5)
Neutrophils Relative %: 50 %
Platelet Count: 300 10*3/uL (ref 140–400)
RBC: 5.72 MIL/uL (ref 4.20–5.82)
RDW: 13.5 % (ref 11.0–14.6)
WBC: 6.7 10*3/uL (ref 4.0–10.3)

## 2018-04-10 LAB — CMP (CANCER CENTER ONLY)
ALK PHOS: 56 U/L (ref 38–126)
ALT: 24 U/L (ref 0–44)
AST: 22 U/L (ref 15–41)
Albumin: 4.1 g/dL (ref 3.5–5.0)
Anion gap: 10 (ref 5–15)
BUN: 9 mg/dL (ref 6–20)
CALCIUM: 9.1 mg/dL (ref 8.9–10.3)
CO2: 24 mmol/L (ref 22–32)
CREATININE: 0.91 mg/dL (ref 0.61–1.24)
Chloride: 107 mmol/L (ref 98–111)
GFR, Est AFR Am: >60 mL/min (ref >60)
Glucose, Bld: 161 mg/dL — ABNORMAL HIGH (ref 70–99)
Potassium: 5.2 mmol/L — ABNORMAL HIGH (ref 3.5–5.1)
Sodium: 141 mmol/L (ref 135–145)
Total Bilirubin: 0.4 mg/dL (ref 0.3–1.2)
Total Protein: 8 g/dL (ref 6.5–8.1)

## 2018-04-10 LAB — LACTATE DEHYDROGENASE: LDH: 197 U/L — AB (ref 98–192)

## 2018-04-10 NOTE — Telephone Encounter (Signed)
Gave patient avs report and appointments for November/December - dates per patient due to travel. Central radiology will call re scan.

## 2018-04-10 NOTE — Progress Notes (Addendum)
[facility]  [phone] [phone]  Clinic Follow up Note   Patient Care Team: Terressa Koyanagi, DO as PCP - General (Family Medicine) 04/10/2018  DIAGNOSIS: Inguinal lymphadenopathy  HISTORY OF PRESENT ILLNESS: The patient was initially seen by Dr. Gweneth Dimitri on 06/20/2017 for right inguinal adenopathy that the patient palpated and felt to be enlarging. He notified PCP during routine annual wellness check. He had been exercising and thought the lump was muscle-related. On 04/27/2017 soft tissue ultrasound showed a 3.1 x 1.4 x 2.6 complex cystic mass with internal flow corresponding to the palpable mass.  MRI of the pelvis on 05/13/2017 showed 2 enlarged right inguinal lymph nodes suspicious for lymphoma.  Excisional biopsy per Dr. Corliss Skains on 05/24/2017 showed florid lymphoid hyperplasia.  Flow cytometry negative for monoclonal B-cell population or abnormal T-cell phenotype.  There was no LDH elevation or hematologic abnormalities.  Follow-up CT neck/CAP on 06/27/2017 showed interval resection of enlarged right inguinal lymph node and stable borderline enlarged right inguinal and external iliac lymph nodes.  There was no other lymphadenopathy or soft tissue masses in the chest abdomen or pelvis.  Follow-up in 10/12/2017, blood counts remain stable, no significant adenopathy on exam; Dr. Gweneth Dimitri recommended to continue observation.   PMH is significant for HTN and DM. He travels for work and exercises regularly. Denies personal or family history of cancer. He is married with 1 healthy child. Denies drug, alcohol, or tobacco use.   INTERVAL HISTORY: Today he feels well.  He has gained weight since last follow-up due to frequent travel and abnormal eating habits.  Denies fever or night sweats.  He has periodic constipation which he manages with MiraLAX PRN.  Denies hematuria or dysuria, cough, chest pain, dyspnea, pain, swelling, or bleeding.  REVIEW OF SYSTEMS:     Constitutional: Denies fevers, chills, fatigue, weight loss, or night sweats Respiratory: Denies cough, dyspnea or wheezes Cardiovascular: Denies palpitation, chest discomfort or lower extremity swelling Gastrointestinal:  Denies nausea, vomiting, diarrhea, hematochezia, heartburn or change in bowel habits (+) intermittent constipation Lymphatics: Denies new lymphadenopathy or easy bruising Neurological:Denies weaknesses All other systems were reviewed with the patient and are negative.  MEDICAL HISTORY:  Past Medical History:  Diagnosis Date  . BMI 28.0-28.9,adult 06/11/2016  . Essential hypertension 06/11/2016  . Hyperlipemia 03/08/2016  . Type 2 diabetes mellitus without complication, without long-term current use of insulin ([facility]) 03/08/2016    SURGICAL HISTORY: Past Surgical History:  Procedure Laterality Date  . LYMPH NODE BIOPSY Right 05/13/2017    I have reviewed the social history and family history with the patient and they are unchanged from previous note.  ALLERGIES:  has No Known Allergies.  MEDICATIONS:  Current Outpatient Medications  Medication Sig Dispense Refill  . losartan (COZAAR) 50 MG tablet Take 1.5 tablets (75 mg total) by mouth daily. 135 tablet 1  . rosuvastatin (CRESTOR) 5 MG tablet TAKE 1 TABLET DAILY 90 tablet 0  . sitaGLIPtin-metformin (JANUMET) 50-500 MG tablet Take 1 tablet by mouth 2 (two) times daily with a meal. 180 tablet 1   No current facility-administered medications for this visit.     PHYSICAL EXAMINATION: ECOG PERFORMANCE STATUS: 0 - Asymptomatic  Vitals:   04/10/18 0814  BP: (!) 164/87  Pulse: 86  Resp: 18  Temp: 98.6 F (37 C)  SpO2: 100%   Filed Weights   04/10/18 0814  Weight: 184 lb 6.4 oz (83.6 kg)    GENERAL:alert, no distress and comfortable SKIN: no rashes  or significant lesions EYES:  sclera clear OROPHARYNX:no thrush or ulcers LYMPH:  no palpable cervical, supraclavicular, axillary, or inguinal  lymphadenopathy. Right inguinal excision biopsy site is well healed. LUNGS: clear to auscultation with normal breathing effort HEART: regular rate & rhythm, no lower extremity edema ABDOMEN:abdomen soft, non-tender and normal bowel sounds Musculoskeletal: no cyanosis of digits and no clubbing  NEURO: alert & oriented x 3 with fluent speech, no focal motor deficits  LABORATORY DATA:  I have reviewed the data as listed CBC Latest Ref Rng & Units 04/10/2018 01/02/2018 10/03/2017  WBC 4.0 - 10.3 K/uL 6.7 7.1 6.3  Hemoglobin 13.0 - 17.1 g/dL 16.115.9 09.615.6 04.516.5  Hematocrit 38.4 - 49.9 % 48.2 46.8 48.8  Platelets 140 - 400 K/uL 300 268 239     CMP Latest Ref Rng & Units 04/10/2018 01/02/2018 08/26/2017  Glucose 70 - 99 mg/dL 409(W161(H) 119129 99  BUN 6 - 20 mg/dL 9 12 13   Creatinine 0.61 - 1.24 mg/dL 1.470.91 8.290.97 5.620.93  Sodium 135 - 145 mmol/L 141 141 139  Potassium 3.5 - 5.1 mmol/L 5.2(H) 4.7 4.5  Chloride 98 - 111 mmol/L 107 106 99  CO2 22 - 32 mmol/L 24 27 30   Calcium 8.9 - 10.3 mg/dL 9.1 9.6 9.7  Total Protein 6.5 - 8.1 g/dL 8.0 7.1 -  Total Bilirubin 0.3 - 1.2 mg/dL 0.4 0.6 -  Alkaline Phos 38 - 126 U/L 56 55 -  AST 15 - 41 U/L 22 20 -  ALT 0 - 44 U/L 24 29 -    RADIOGRAPHIC STUDIES: I have personally reviewed the radiological images as listed and agreed with the findings in the report. No results found.   ASSESSMENT & PLAN: [age] year old pleasant gentleman with previously palpable right inguinal lymph node   1. Right inguinal lymphadenopathy s/p biopsy 05/24/2017 with florid lymphoid hyperplasia, flow cytometry negative for monoclonal B-cell population or abnormal T-cell phenotype -We reviewed his medical record including labs, pathology, and imaging in detail with the patient.  Work-up was negative for lymphoma.   -He denies B symptoms.  Physical exam is unremarkable for recurrent or new lymphadenopathy.  CBC is normal, LDH pending. -The patient was seen and examined by Dr. Mosetta PuttFeng who recommends to  continue observation and repeat CT in November 2019. If exam and imaging are stable at the time, will likely discharge from our clinic to be monitored by PCP. The patient agrees with the plan.   2. Constipation -I encouraged him to continue miralax PRN, remain active, and hydrate well.   3. HTN, DM -continue medication regimen and f/u per PCP  PLAN: -Labs, imaging, path reviewed  -Continue observation -Repeat CT AP w contrast in 3 months, ordered today -Lab, f/u in 3 months   Orders Placed This Encounter  Procedures  . CT Abdomen Pelvis W Contrast    Standing Status:   Future    Standing Expiration Date:   04/10/2019    Order Specific Question:   ** REASON FOR EXAM (FREE TEXT)    Answer:   observation of previously enlarged right inguinal LN    Order Specific Question:   If indicated for the ordered procedure, I authorize the administration of contrast media per Radiology protocol    Answer:   Yes    Order Specific Question:   Preferred [facility]?    [facility]    Order Specific Question:   Is Oral Contrast requested for this exam?  Answer:   Yes, Per Radiology protocol    Order Specific Question:   Radiology Contrast Protocol - do NOT remove file path    Answer:   [email or web address]   All questions were answered. The patient knows to call the clinic with any problems, questions or concerns. No barriers to learning was detected. I spent 20 minutes counseling the patient face to face. The total time spent in the appointment was 30 minutes and more than 50% was on counseling and review of test results     Pollyann SamplesLacie K Joas Motton, NP 04/10/18    Addendum  I have seen the patient, examined him. I agree with the assessment and and plan and have edited the notes.   I have reviewed his previous work-up, including biopsy results.  Mr. Sherryll BurgerShah is doing well, no clinical symptoms, exam was negative for palpable peripheral adenopathy.  Lab  reviewed, no concerns.  To repeat a CT scan in 3 months, if negative, will discharge patient from our clinic.  Malachy MoodYan Feng   04/10/2018

## 2018-04-18 ENCOUNTER — Telehealth: Payer: Self-pay

## 2018-04-18 NOTE — Telephone Encounter (Signed)
Left message for patient per Dr. Mosetta PuttFeng lab results CBC and CMP were normal, LDH was slight above normal range likely unspecific, Dr. Mosetta PuttFeng is not concerned.

## 2018-04-18 NOTE — Telephone Encounter (Signed)
-----   Message from Malachy MoodYan Feng, MD sent at 04/16/2018 10:19 PM EDT ----- Elnita Maxwellheryl, please let pt know his lab results from last week, CBC and CMP WNL,LDH slightly above normal range, likely unspecific, I am not concerned. Thanks  Malachy MoodYan Feng  04/16/2018

## 2018-05-30 DIAGNOSIS — M25531 Pain in right wrist: Secondary | ICD-10-CM | POA: Diagnosis not present

## 2018-05-30 DIAGNOSIS — M9901 Segmental and somatic dysfunction of cervical region: Secondary | ICD-10-CM | POA: Diagnosis not present

## 2018-05-30 DIAGNOSIS — M47812 Spondylosis without myelopathy or radiculopathy, cervical region: Secondary | ICD-10-CM | POA: Diagnosis not present

## 2018-05-30 DIAGNOSIS — M542 Cervicalgia: Secondary | ICD-10-CM | POA: Diagnosis not present

## 2018-05-31 DIAGNOSIS — M542 Cervicalgia: Secondary | ICD-10-CM | POA: Diagnosis not present

## 2018-06-13 DIAGNOSIS — M542 Cervicalgia: Secondary | ICD-10-CM | POA: Diagnosis not present

## 2018-06-15 ENCOUNTER — Ambulatory Visit (INDEPENDENT_AMBULATORY_CARE_PROVIDER_SITE_OTHER): Payer: BLUE CROSS/BLUE SHIELD

## 2018-06-15 DIAGNOSIS — Z23 Encounter for immunization: Secondary | ICD-10-CM | POA: Diagnosis not present

## 2018-07-12 DIAGNOSIS — M542 Cervicalgia: Secondary | ICD-10-CM | POA: Diagnosis not present

## 2018-07-17 ENCOUNTER — Inpatient Hospital Stay: Payer: BLUE CROSS/BLUE SHIELD | Attending: Hematology

## 2018-07-17 ENCOUNTER — Ambulatory Visit (HOSPITAL_COMMUNITY)
Admission: RE | Admit: 2018-07-17 | Discharge: 2018-07-17 | Disposition: A | Discharge status: Home / Self Care | Payer: BLUE CROSS/BLUE SHIELD | Source: Ambulatory Visit | Attending: Nurse Practitioner | Admitting: Nurse Practitioner

## 2018-07-17 DIAGNOSIS — R590 Localized enlarged lymph nodes: Secondary | ICD-10-CM | POA: Insufficient documentation

## 2018-07-17 DIAGNOSIS — R140 Abdominal distension (gaseous): Secondary | ICD-10-CM | POA: Diagnosis not present

## 2018-07-17 DIAGNOSIS — R599 Enlarged lymph nodes, unspecified: Secondary | ICD-10-CM | POA: Diagnosis not present

## 2018-07-17 DIAGNOSIS — M542 Cervicalgia: Secondary | ICD-10-CM | POA: Diagnosis not present

## 2018-07-17 LAB — CMP (CANCER CENTER ONLY)
ALBUMIN: 4.2 g/dL (ref 3.5–5.0)
ALT: 24 U/L (ref 0–44)
ANION GAP: 11 (ref 5–15)
AST: 20 U/L (ref 15–41)
Alkaline Phosphatase: 52 U/L (ref 38–126)
BUN: 10 mg/dL (ref 6–20)
CHLORIDE: 102 mmol/L (ref 98–111)
CO2: 28 mmol/L (ref 22–32)
Calcium: 10.2 mg/dL (ref 8.9–10.3)
Creatinine: 1.02 mg/dL (ref 0.61–1.24)
GFR, Est AFR Am: >60 mL/min (ref >60)
GFR, Estimated: >60 mL/min (ref >60)
GLUCOSE: 131 mg/dL — AB (ref 70–99)
Potassium: 4.7 mmol/L (ref 3.5–5.1)
SODIUM: 141 mmol/L (ref 135–145)
TOTAL PROTEIN: 7.1 g/dL (ref 6.5–8.1)
Total Bilirubin: 0.6 mg/dL (ref 0.3–1.2)

## 2018-07-17 LAB — CBC WITH DIFFERENTIAL (CANCER CENTER ONLY)
ABS IMMATURE GRANULOCYTES: 0.02 10*3/uL (ref 0.00–0.07)
BASOS PCT: 1 %
Basophils Absolute: 0.1 10*3/uL (ref 0.0–0.1)
Eosinophils Absolute: 0.3 10*3/uL (ref 0.0–0.5)
Eosinophils Relative: 5 %
HCT: 47.5 % (ref 39.0–52.0)
HEMOGLOBIN: 15.5 g/dL (ref 13.0–17.0)
Immature Granulocytes: 0 %
LYMPHS PCT: 34 %
Lymphs Abs: 2.4 10*3/uL (ref 0.7–4.0)
MCH: 27.1 pg (ref 26.0–34.0)
MCHC: 32.6 g/dL (ref 30.0–36.0)
MCV: 83 fL (ref 80.0–100.0)
MONO ABS: 0.4 10*3/uL (ref 0.1–1.0)
MONOS PCT: 6 %
Neutro Abs: 3.7 10*3/uL (ref 1.7–7.7)
Neutrophils Relative %: 54 %
Platelet Count: 284 10*3/uL (ref 150–400)
RBC: 5.72 MIL/uL (ref 4.22–5.81)
RDW: 12.9 % (ref 11.5–15.5)
WBC Count: 6.9 10*3/uL (ref 4.0–10.5)
nRBC: 0 % (ref 0.0–0.2)

## 2018-07-17 LAB — LACTATE DEHYDROGENASE: LDH: 164 U/L (ref 98–192)

## 2018-07-17 MED ORDER — IOHEXOL 300 MG/ML  SOLN
100.0000 mL | Freq: Once | INTRAMUSCULAR | Status: AC | PRN
  Administered 2018-07-17: 100 mL via INTRAVENOUS

## 2018-07-17 MED ORDER — SODIUM CHLORIDE (PF) 0.9 % IJ SOLN
INTRAMUSCULAR | Status: AC
  Filled 2018-07-17: qty 50

## 2018-07-23 NOTE — Progress Notes (Signed)
[facility]  Telephone:(336) [phone] Fax:([phone]  Clinic Follow up Note   Patient Care Team: Terressa KoyanagiKim, Hannah R, DO as PCP - General (Family Medicine) 07/24/2018  Chief Complaint: F/u on lymphadenopathy  INTERVAL HISTORY: Martin Sanchez is a 42 y.o. male who is here for follow-up. He is here alone. He is doing well. He denies new or enlarges inguinal lymph nodes. He denies night sweats or weight loss. He is gaining weight, and relates that to eating more and exercising less.   He says that he travels a lot. He is an Art gallery managerengineer.   Pertinent positives and negatives of review of systems are listed and detailed within the above HPI.   REVIEW OF SYSTEMS:   Constitutional: Denies fevers, chills or abnormal weight loss Eyes: Denies blurriness of vision Ears, nose, mouth, throat, and face: Denies mucositis or sore throat Respiratory: Denies cough, dyspnea or wheezes Cardiovascular: Denies palpitation, chest discomfort or lower extremity swelling Gastrointestinal:  Denies nausea, heartburn or change in bowel habits Skin: Denies abnormal skin rashes Lymphatics: Denies new lymphadenopathy or easy bruising Neurological:Denies numbness, tingling or new weaknesses Behavioral/Psych: Mood is stable, no new changes  All other systems were reviewed with the patient and are negative.  MEDICAL HISTORY:  Past Medical History:  Diagnosis Date  . BMI 28.0-28.9,adult 06/11/2016  . Essential hypertension 06/11/2016  . Hyperlipemia 03/08/2016  . Type 2 diabetes mellitus without complication, without long-term current use of insulin (HCC) 03/08/2016    SURGICAL HISTORY: Past Surgical History:  Procedure Laterality Date  . LYMPH NODE BIOPSY Right 05/13/2017    I have reviewed the social history and family history with the patient and they are unchanged from previous note.  ALLERGIES:  has No Known Allergies.  MEDICATIONS:  Current Outpatient Medications  Medication Sig Dispense  Refill  . losartan (COZAAR) 50 MG tablet Take 1.5 tablets (75 mg total) by mouth daily. 135 tablet 1  . rosuvastatin (CRESTOR) 5 MG tablet TAKE 1 TABLET DAILY 90 tablet 0  . sitaGLIPtin-metformin (JANUMET) 50-500 MG tablet Take 1 tablet by mouth 2 (two) times daily with a meal. 180 tablet 1   No current facility-administered medications for this visit.     PHYSICAL EXAMINATION: ECOG PERFORMANCE STATUS: 0 - Asymptomatic  Vitals:   07/24/18 0827 07/24/18 0828  BP: (!) 159/95 (!) 144/93  Pulse: 77   Resp: 18   Temp: 98 F (36.7 C)   SpO2: 100%    Filed Weights   07/24/18 0827  Weight: 192 lb 11.2 oz (87.4 kg)    GENERAL:alert, no distress and comfortable SKIN: skin color, texture, turgor are normal, no rashes or significant lesions EYES: normal, Conjunctiva are pink and non-injected, sclera clear OROPHARYNX:no exudate, no erythema and lips, buccal mucosa, and tongue normal  NECK: supple, thyroid normal size, non-tender, without nodularity LYMPH:  no palpable lymphadenopathy in the cervical, axillary or inguinal areas. LUNGS: clear to auscultation and percussion with normal breathing effort HEART: regular rate & rhythm and no murmurs and no lower extremity edema ABDOMEN:abdomen soft, non-tender and normal bowel sounds Musculoskeletal:no cyanosis of digits and no clubbing  NEURO: alert & oriented x 3 with fluent speech, no focal motor/sensory deficits  LABORATORY DATA:  I have reviewed the data as listed CBC Latest Ref Rng & Units 07/17/2018 04/10/2018 01/02/2018  WBC 4.0 - 10.5 K/uL 6.9 6.7 7.1  Hemoglobin 13.0 - 17.0 g/dL 45.415.5 09.815.9 11.915.6  Hematocrit 39.0 - 52.0 % 47.5 48.2 46.8  Platelets 150 - 400  K/uL 284 300 268     CMP Latest Ref Rng & Units 07/17/2018 04/10/2018 01/02/2018  Glucose 70 - 99 mg/dL 161(W) 960(A) 540  BUN 6 - 20 mg/dL 10 9 12   Creatinine 0.61 - 1.24 mg/dL 9.81 1.91 4.78  Sodium 135 - 145 mmol/L 141 141 141  Potassium 3.5 - 5.1 mmol/L 4.7 5.2(H) 4.7    Chloride 98 - 111 mmol/L 102 107 106  CO2 22 - 32 mmol/L 28 24 27   Calcium 8.9 - 10.3 mg/dL 29.5 9.1 9.6  Total Protein 6.5 - 8.1 g/dL 7.1 8.0 7.1  Total Bilirubin 0.3 - 1.2 mg/dL 0.6 0.4 0.6  Alkaline Phos 38 - 126 U/L 52 56 55  AST 15 - 41 U/L 20 22 20   ALT 0 - 44 U/L 24 24 29       RADIOGRAPHIC STUDIES: I have personally reviewed the radiological images as listed and agreed with the findings in the report. No results found.   07/17/2018 CT AP IMPRESSION: 1. Slight interval progression of right pelvic sidewall and inguinal lymph nodes with mild enlargement of right external iliac and common femoral nodes on today's study. 2. Mild rotation of small bowel without obstruction    ASSESSMENT & PLAN:  Keston Seever is a 42 y.o. male with history of  1. Right inguinal and pelvic lymphadenopathy s/p biopsy 05/24/2017 with florid lymphoid hyperplasia, -He initially presented with a palpable right inguinal lymphadenopathy, surgical biopsy of the lymph nodes was negative for lymphoma. -He is clinically doing very well, no B symptoms or other concerning signs. - I reviewed and discussed CT scan results in person.  He has developed multiple enlarged pelvic lymph nodes, no palpable peripheral adenopathy.  We discussed her that this is likely benign process, although low grade lymphomas cannot be ruled out. I do not recommend biopsy at this point. -I educated him about warning symptoms including night sweats and unexplained weight loss. He understands.  -Labs reviewed, CBC is WNLs. CMP showed BG 131. LDH 164. No concerns  -f/u in 4 months for lab and exam  -Repeat scan in 8 months   2. HTN, DM -f/u with PCP and continue recommended medications -CMP today showed BG 131. He sees his PCP every 6 months -I educated him about healthy diet and exercise.   Plan -lab and CT scan reviewed -f/u in 4 months -plan to repeat scan in 8 months, will order on next visit   No problem-specific  Assessment & Plan notes found for this encounter.   No orders of the defined types were placed in this encounter.  All questions were answered. The patient knows to call the clinic with any problems, questions or concerns. No barriers to learning was detected. I spent 20 minutes counseling the patient face to face. The total time spent in the appointment was 25 minutes and more than 50% was on counseling and review of test results  I, Noor Dweik am acting as scribe for Dr. Malachy Mood.  I have reviewed the above documentation for accuracy and completeness, and I agree with the above.      Malachy Mood, MD 07/24/2018    \

## 2018-07-24 ENCOUNTER — Telehealth: Payer: Self-pay

## 2018-07-24 ENCOUNTER — Inpatient Hospital Stay: Payer: BLUE CROSS/BLUE SHIELD | Attending: Hematology | Admitting: Hematology

## 2018-07-24 ENCOUNTER — Encounter: Payer: Self-pay | Admitting: Hematology

## 2018-07-24 DIAGNOSIS — I10 Essential (primary) hypertension: Secondary | ICD-10-CM | POA: Diagnosis not present

## 2018-07-24 DIAGNOSIS — R599 Enlarged lymph nodes, unspecified: Secondary | ICD-10-CM | POA: Diagnosis not present

## 2018-07-24 DIAGNOSIS — R591 Generalized enlarged lymph nodes: Secondary | ICD-10-CM | POA: Insufficient documentation

## 2018-07-24 DIAGNOSIS — E119 Type 2 diabetes mellitus without complications: Secondary | ICD-10-CM | POA: Insufficient documentation

## 2018-07-24 NOTE — Telephone Encounter (Signed)
Patient declined avs and calender of upcoming appointment. Per 12/2 los

## 2018-07-31 ENCOUNTER — Other Ambulatory Visit: Payer: Self-pay | Admitting: Family Medicine

## 2018-08-03 ENCOUNTER — Other Ambulatory Visit: Payer: Self-pay | Admitting: Family Medicine

## 2018-08-22 DIAGNOSIS — M542 Cervicalgia: Secondary | ICD-10-CM | POA: Diagnosis not present

## 2018-09-18 ENCOUNTER — Encounter: Payer: BLUE CROSS/BLUE SHIELD | Admitting: Family Medicine

## 2018-09-29 DIAGNOSIS — M542 Cervicalgia: Secondary | ICD-10-CM | POA: Diagnosis not present

## 2018-11-03 IMAGING — MR MR PELVIS WO/W CM
4 of 9 series · 19 of 48 positions shown · IV contrast (multihance)
Comparison: Ultrasound 04/27/2017

CLINICAL DATA: Palpable mass in the right groin area.

EXAM:
MRI PELVIS WITHOUT AND WITH CONTRAST
TECHNIQUE: Multiplanar multisequence MR imaging of the pelvis was performed
both before and after administration of intravenous contrast.
CONTRAST:  17mL MULTIHANCE GADOBENATE DIMEGLUMINE 529 MG/ML IV SOLN

[Series 3: T1 · axial · 8.0mm · 0.78mm/px · z∈[-138,+212]mm · 5 of 36 slices shown (1 of 3)]
[im 1/36]
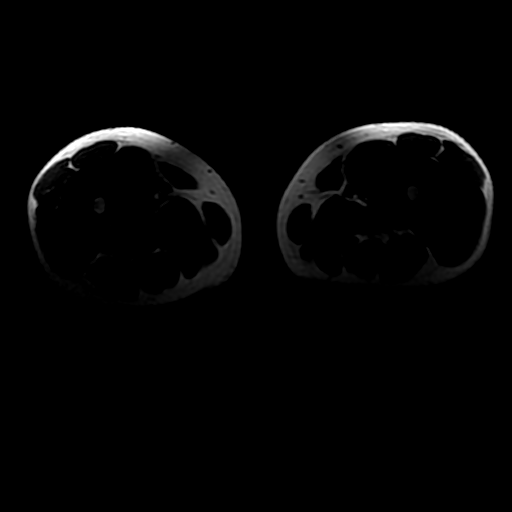
[im 9/36]
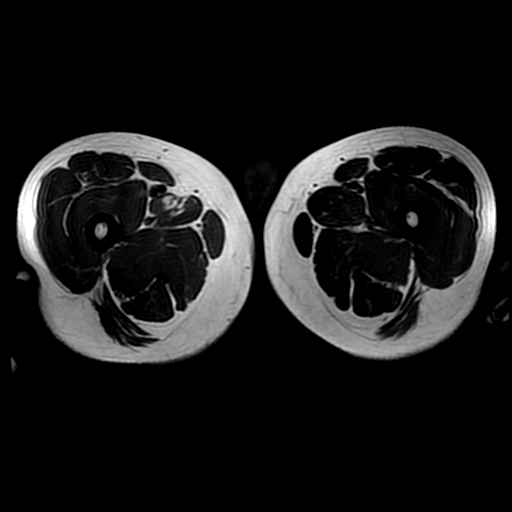
[im 18/36]
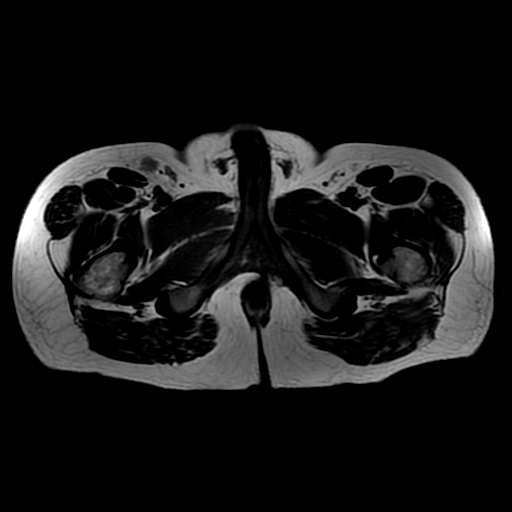
[im 27/36]
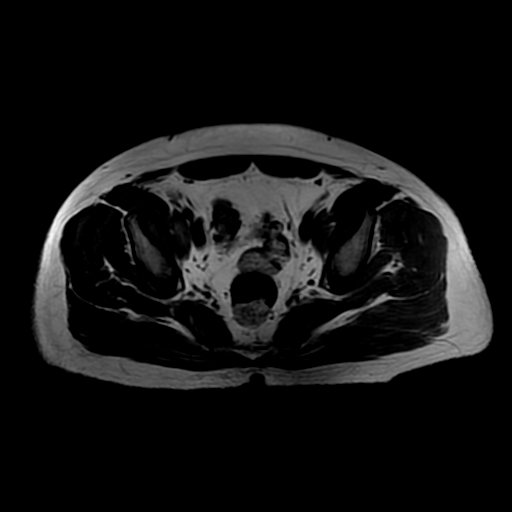
[im 36/36]
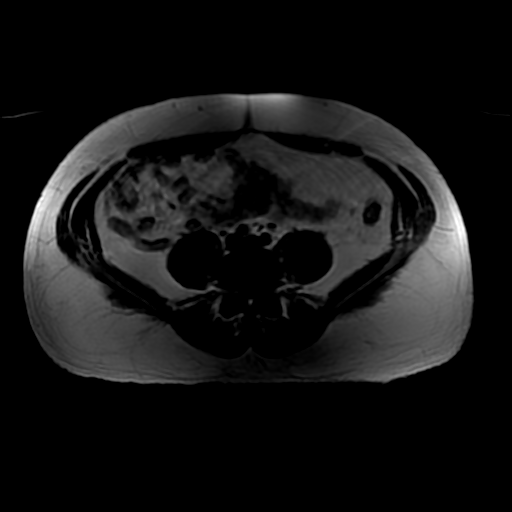

[Series 4: T1 · coronal · 5.0mm · 0.86mm/px · 4 of 29 slices shown (2 of 3)]
[im 1/29]
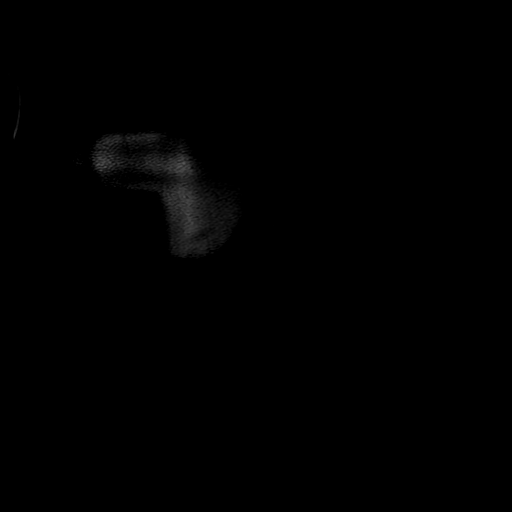
[im 10/29]
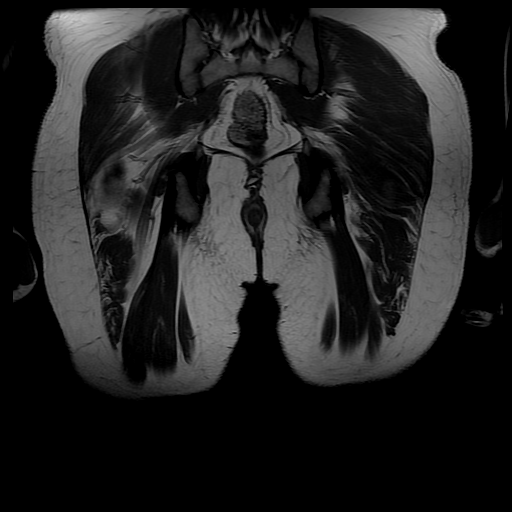
[im 19/29]
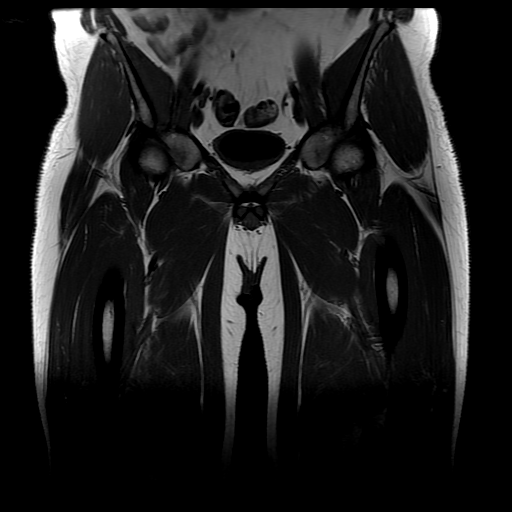
[im 29/29]
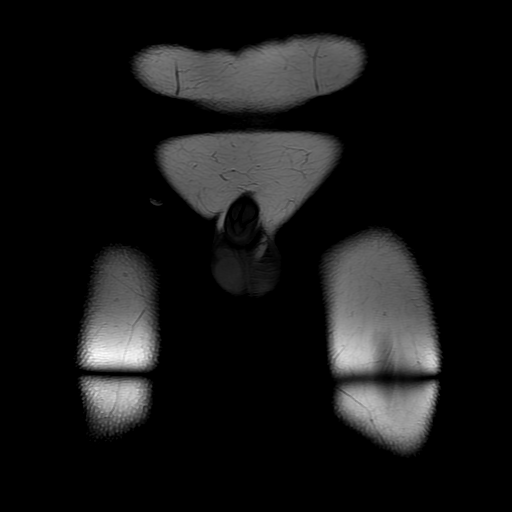

[Series 5: T2 fat-sat · axial · 8.0mm · 0.78mm/px · z∈[-138,+212]mm · 5 of 36 slices shown]
[im 1/36]
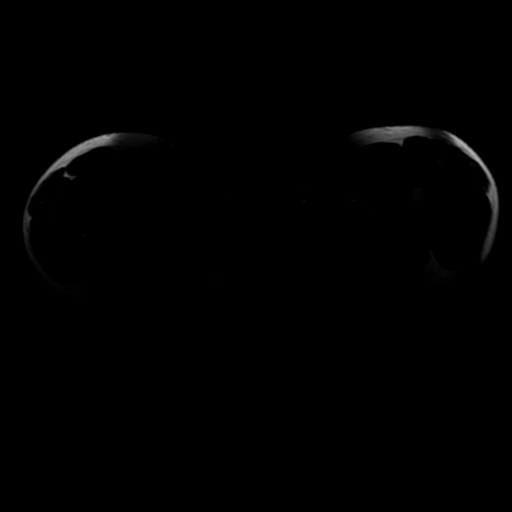
[im 9/36]
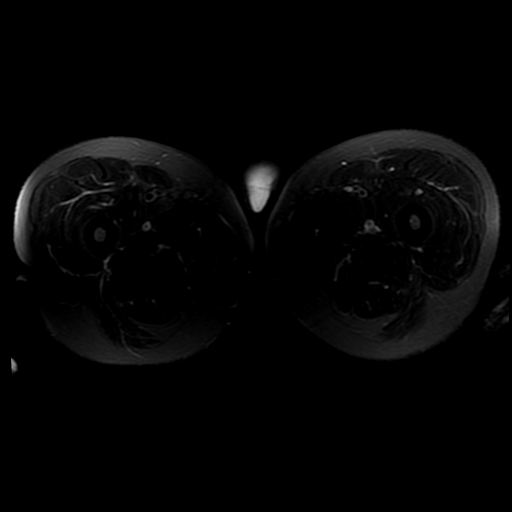
[im 18/36]
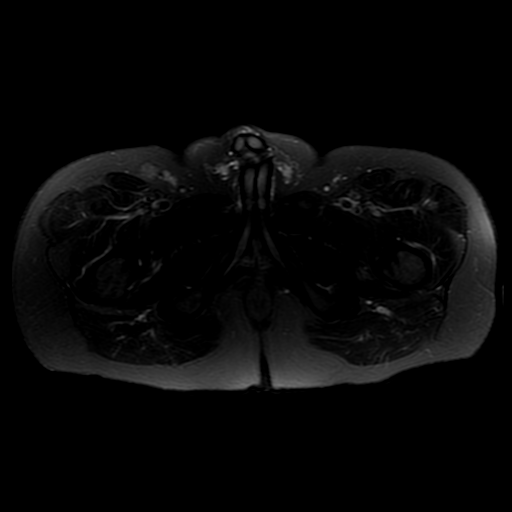
[im 27/36]
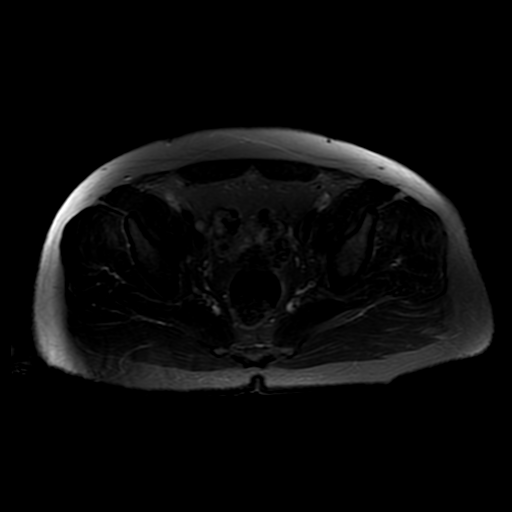
[im 36/36]
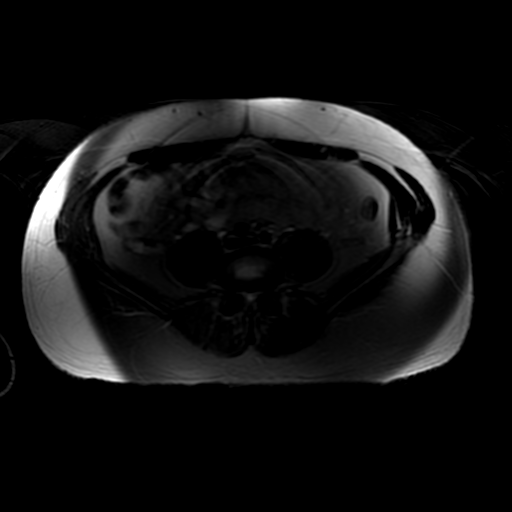

[Series 6: T1 · axial · 8.0mm · 0.78mm/px · z∈[-138,+212]mm · 5 of 36 slices shown (3 of 3)]
[im 1/36]
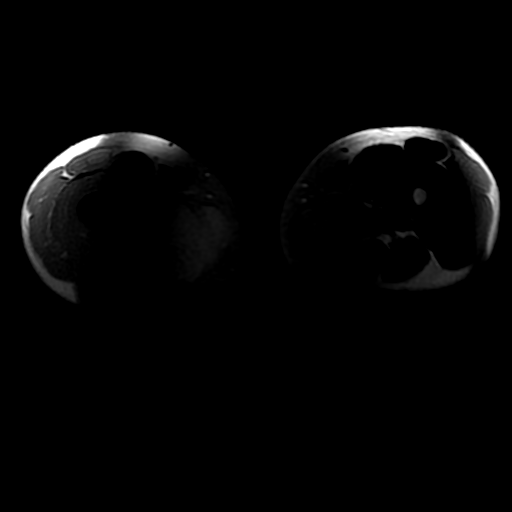
[im 8/36]
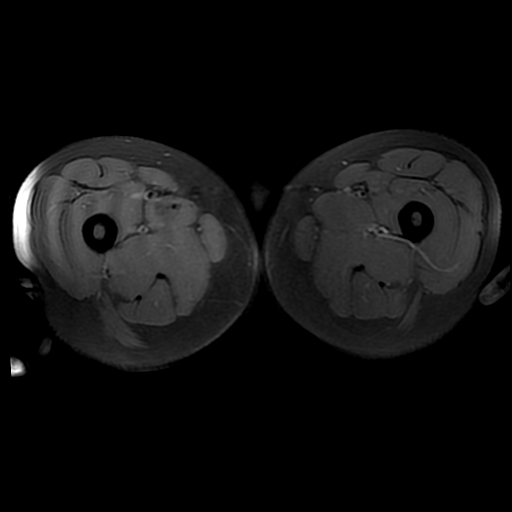
[im 15/36]
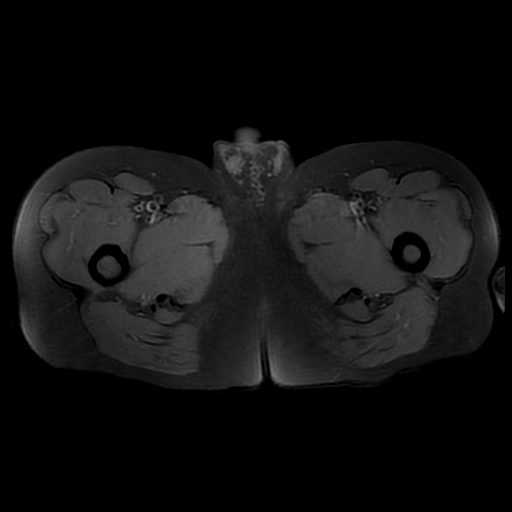
[im 22/36]
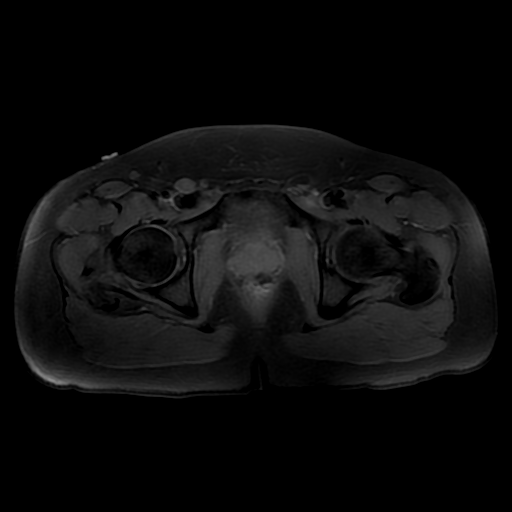
[im 36/36]
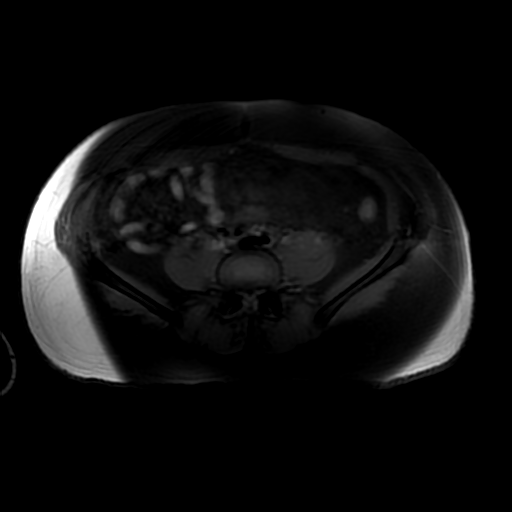

[19 of 48 positions shown; findings below may reference images not displayed]

FINDINGS: Urinary Tract: The bladder is normal. No bladder mass or bladder
wall thickening.

Bowel:  The visualized small bowel and colon are unremarkable.

Vascular/Lymphatic: The vascular structures appear normal. No
intrapelvic lymphadenopathy.

Enlarged right inguinal lymph nodes. The largest node measures 31 x
21 x 16.5 mm and corresponds to the ultrasound examination. It
appears cystic on the ultrasound examination but is a solid
enhancing lymph node based on MR characteristics. A more medial and
slightly superior lymph node measures 17 x 12.5 mm. No left-sided
inguinal adenopathy.

Reproductive: The prostate gland and seminal vesicles are. Small
left scrotal hydrocele is noted.

Other: No free pelvic fluid collections, pelvic mass or
lymphadenopathy.

Musculoskeletal: No significant bony findings. The hip and thigh
musculature appears normal.
IMPRESSION: 1. Two enlarged right inguinal lymph nodes the suspicious for
lymphoma. Recommend biopsy.
2. No enlarged intrapelvic lymph nodes for pelvic mass.

## 2018-11-15 ENCOUNTER — Telehealth: Payer: Self-pay | Admitting: Hematology

## 2018-11-15 NOTE — Telephone Encounter (Signed)
R/s appt per 3/25 sch message - pt aware of appt date and time

## 2018-11-20 ENCOUNTER — Ambulatory Visit: Payer: BLUE CROSS/BLUE SHIELD | Admitting: Hematology

## 2018-11-20 ENCOUNTER — Other Ambulatory Visit: Payer: BLUE CROSS/BLUE SHIELD

## 2018-12-11 ENCOUNTER — Other Ambulatory Visit: Payer: Self-pay | Admitting: Family Medicine

## 2018-12-11 MED ORDER — LOSARTAN POTASSIUM 50 MG PO TABS: 75.0000 mg | ORAL_TABLET | Freq: Every day | ORAL | 0 refills | Status: DC

## 2018-12-11 MED ORDER — SITAGLIPTIN PHOS-METFORMIN HCL 50-500 MG PO TABS: 1.0000 | ORAL_TABLET | Freq: Two times a day (BID) | ORAL | 0 refills | Status: DC

## 2018-12-11 NOTE — Telephone Encounter (Signed)
I called the pt and informed him he needs an appt via phone through a virtual visit as the last visit was in May 2019.  Patient stated he will call back for an appt and a 30-day supply was sent to the local pharmacy.

## 2018-12-12 NOTE — Telephone Encounter (Signed)
Pt stated his 2 refills  Cozaar and Janumet was sent to wrong pharmacy it should have went to [facility]

## 2018-12-13 NOTE — Telephone Encounter (Signed)
Doxy.me appointment scheduled for 12/14/2018.

## 2018-12-14 ENCOUNTER — Other Ambulatory Visit: Payer: Self-pay

## 2018-12-14 ENCOUNTER — Encounter: Payer: Self-pay | Admitting: Family Medicine

## 2018-12-14 ENCOUNTER — Ambulatory Visit (INDEPENDENT_AMBULATORY_CARE_PROVIDER_SITE_OTHER): Payer: BLUE CROSS/BLUE SHIELD | Admitting: Family Medicine

## 2018-12-14 VITALS — Wt 180.0 lb

## 2018-12-14 DIAGNOSIS — E1159 Type 2 diabetes mellitus with other circulatory complications: Secondary | ICD-10-CM | POA: Diagnosis not present

## 2018-12-14 DIAGNOSIS — I10 Essential (primary) hypertension: Secondary | ICD-10-CM

## 2018-12-14 DIAGNOSIS — B356 Tinea cruris: Secondary | ICD-10-CM

## 2018-12-14 DIAGNOSIS — E119 Type 2 diabetes mellitus without complications: Secondary | ICD-10-CM | POA: Diagnosis not present

## 2018-12-14 DIAGNOSIS — I152 Hypertension secondary to endocrine disorders: Secondary | ICD-10-CM

## 2018-12-14 DIAGNOSIS — E785 Hyperlipidemia, unspecified: Secondary | ICD-10-CM

## 2018-12-14 DIAGNOSIS — L309 Dermatitis, unspecified: Secondary | ICD-10-CM

## 2018-12-14 DIAGNOSIS — E1169 Type 2 diabetes mellitus with other specified complication: Secondary | ICD-10-CM | POA: Diagnosis not present

## 2018-12-14 MED ORDER — TRIAMCINOLONE ACETONIDE 0.1 % EX CREA: 1.0000 "application " | TOPICAL_CREAM | Freq: Two times a day (BID) | CUTANEOUS | 0 refills | Status: DC

## 2018-12-14 MED ORDER — LOSARTAN POTASSIUM 50 MG PO TABS: 75.0000 mg | ORAL_TABLET | Freq: Every day | ORAL | 0 refills | Status: DC

## 2018-12-14 MED ORDER — ROSUVASTATIN CALCIUM 5 MG PO TABS: ORAL_TABLET | ORAL | 1 refills | Status: DC

## 2018-12-14 MED ORDER — SITAGLIPTIN PHOS-METFORMIN HCL 50-500 MG PO TABS: 1.0000 | ORAL_TABLET | Freq: Two times a day (BID) | ORAL | 0 refills | Status: DC

## 2018-12-14 NOTE — Progress Notes (Signed)
Virtual Visit via Video Note  I connected with Martin Sanchez  on 12/14/18 at 11:15 AM EDT by a video enabled telemedicine application and verified that I am speaking with the correct person using two identifiers.  Location patient: home Location provider:work or home office Persons participating in the virtual visit: patient, provider  I discussed the limitations of evaluation and management by telemedicine and the availability of in person appointments. The patient expressed understanding and agreed to proceed.   HPI:  Martin Sanchez is a pleasant 43 y.o. here for follow up. Chronic medical problems summarized below were reviewed for changes and stability and were updated as needed below. These issues and their treatment remain stable for the most part.  Currently furloughed. Walking 8000 steps per day, trying to eat healthy. BS has been 95-108. checks at home. Denies CP, SOB, DOE, treatment intolerance. Has rash sometimes on trunk or elsewhere. Small patches of dry itchy skin. They resolve with hydrocortisone cream. Has one patch now on L abd. Also has jock itch at times, scaly itchy rash.   DM: -complications: none known -dx in 2017 in [place] with neg optho exam and EKG at the time -initial hgba1c 10.9 (6/17) -->5.9 08/2017 Mostly with lifestyle changes -meds: janumet  HLD:  -refused to take statininitially, then started Crestor low dose 5 mg 08/2017 -denies hx statin intolerance  Elevated BloodPressure: -he did not want to do blood pressure medication - finally agreed to losart[place]hen he stopped taking it,restarted 08/2017 -denies: CP, SOB, DOE, HA, vision changes  Elevated liver enzymes: -in [place] -resolved off alcohol  LAD: -R inguinal -seeing oncology for management -florid lymphoid hyperplasia on biopsy in 2018 -they are monitoring yearly with CT -he denies new symptoms, fevers, swelling  ROS: See pertinent positives and negatives per HPI.  Past Medical History:   Diagnosis Date  . BMI 28.0-28.9,adult 06/11/2016  . Essential hypertension 06/11/2016  . Hyperlipemia 03/08/2016  . Type 2 diabetes mellitus without complication, without long-term current use of insulin (HCC) 03/08/2016    Past Surgical History:  Procedure Laterality Date  . LYMPH NODE BIOPSY Right 05/13/2017    Family History  Problem Relation Age of Onset  . Diabetes Father     SOCIAL HX: see  hpi   Current Outpatient Medications:  .  losartan (COZAAR) 50 MG tablet, Take 1.5 tablets (75 mg total) by mouth daily., Disp: 135 tablet, Rfl: 0 .  rosuvastatin (CRESTOR) 5 MG tablet, TAKE 1 TABLET DAILY, Disp: 90 tablet, Rfl: 1 .  sitaGLIPtin-metformin (JANUMET) 50-500 MG tablet, Take 1 tablet by mouth 2 (two) times daily with a meal., Disp: 180 tablet, Rfl: 0  EXAM:  VITALS per patient if applicable: Today's Vitals   12/14/18 1058  Weight: 180 lb (81.6 kg)   Body mass index is 27.57 kg/m.   GENERAL: alert, oriented, appears well and in no acute distress  HEENT: atraumatic, conjunttiva clear, no obvious abnormalities on inspection of external nose and ears  NECK: normal movements of the head and neck  LUNGS: on inspection no signs of respiratory distress, breathing rate appears normal, no obvious gross SOB, gasping or wheezing  CV: no obvious cyanosis  MS: moves all visible extremities without noticeable abnormality  SKIN: small patch of dry mildly erythematous skin on the L abd  PSYCH/NEURO: pleasant and cooperative, no obvious depression or anxiety, speech and thought processing grossly intact  ASSESSMENT AND PLAN:  Discussed the following assessment and plan:  Hypertension associated with diabetes (HCC)  Type 2  diabetes mellitus without complication, without long-term current use of insulin (HCC)  Hyperlipidemia associated with type 2 diabetes mellitus (HCC)  Congratulated and encouraged on continued healthy lifestyle and home monitoring of glucose. He  prefers to avoid OV until improvement in COVID19 pandemic so will hold off on labs for now.  Did advise will need to monitor BP at home and when we follow up on the rash can go over those values.  Discussed potential etiologies of rash and suspect eczema vs fungal vs other. Opted to try to clear up with emollient and top steroid. Follow up recheck in 2-3 weeks.   I discussed the assessment and treatment plan with the patient. The patient was provided an opportunity to ask questions and all were answered. The patient agreed with the plan and demonstrated an understanding of the instructions.   The patient was advised to call back or seek an in-person evaluation if the symptoms worsen or if the condition fails to improve as anticipated.   Follow up instructions: Advised assistant Ronnald CollumJo Anne to help patient arrange the following: -follow up with Dr. Selena BattenKim in 3-4 weeks -see if he needs help getting BP cuff/glu monitor supplies -TOC with Dr. Hassan RowanKoberlein in 3 months   Terressa KoyanagiHannah R Tavarus Poteete, DO

## 2018-12-14 NOTE — Patient Instructions (Signed)
Please obtain or fix blood pressure cuff and check blood pressure 3 days per week and keep log.  Continue a healthy diet, regular exercise and your current medications.  For the jock itch please obtain jock itch cream over the counter as we discussed and use twice daily.  For the dry skin elsewhere please get aquaphor and use after each shower. Use a gentle hypoallergenic soap such as dove. Use the triamcinilone cream 1-2 times daily for 1 week as needed.

## 2019-01-11 ENCOUNTER — Ambulatory Visit: Payer: Self-pay | Admitting: Family Medicine

## 2019-01-17 ENCOUNTER — Telehealth: Payer: Self-pay | Admitting: Hematology

## 2019-01-17 NOTE — Telephone Encounter (Signed)
Due to LB off 6/1 appointment between YF/LCB adjusted. Moved from 6/1 to 6/12. Confirmed with patient.

## 2019-01-22 ENCOUNTER — Other Ambulatory Visit: Payer: BLUE CROSS/BLUE SHIELD

## 2019-01-22 ENCOUNTER — Ambulatory Visit: Payer: BLUE CROSS/BLUE SHIELD | Admitting: Hematology

## 2019-01-31 NOTE — Progress Notes (Signed)
[facility]   Telephone:([phone] Fax:(336) [phone]   Clinic Follow up Note   Patient Care Team: Terressa KoyanagiKim, Hannah R, DO as PCP - General (Family Medicine)  Date of Service:  02/02/2019  CHIEF COMPLAINT: F/u on lymphadenopathy   CURRENT THERAPY:  Observation   INTERVAL HISTORY:  Martin Sanchez is here for a follow up of lymphadenopathy. He presents to the clinic alone. He notes he is doing well. He denies any enlarged, LN, fever, chills or night sweats. He has been exercising and has purposefully lost weight. He also changed they way he ate. HIS BG is 100-108 in the AM. He does not check after meal.   REVIEW OF SYSTEMS:   Constitutional: Denies fevers, chills (+) Purposeful weight loss Eyes: Denies blurriness of vision Ears, nose, mouth, throat, and face: Denies mucositis or sore throat Respiratory: Denies cough, dyspnea or wheezes Cardiovascular: Denies palpitation, chest discomfort or lower extremity swelling Gastrointestinal:  Denies nausea, heartburn or change in bowel habits Skin: Denies abnormal skin rashes Lymphatics: Denies new lymphadenopathy or easy bruising Neurological:Denies numbness, tingling or new weaknesses Behavioral/Psych: Mood is stable, no new changes  All other systems were reviewed with the patient and are negative.  MEDICAL HISTORY:  Past Medical History:  Diagnosis Date  . BMI 28.0-28.9,adult 06/11/2016  . Essential hypertension 06/11/2016  . Hyperlipemia 03/08/2016  . Type 2 diabetes mellitus without complication, without long-term current use of insulin (HCC) 03/08/2016    SURGICAL HISTORY: Past Surgical History:  Procedure Laterality Date  . LYMPH NODE BIOPSY Right 05/13/2017    I have reviewed the social history and family history with the patient and they are unchanged from previous note.  ALLERGIES:  has No Known Allergies.  MEDICATIONS:  Current Outpatient Medications  Medication Sig Dispense Refill  . losartan (COZAAR)  50 MG tablet Take 1.5 tablets (75 mg total) by mouth daily. 135 tablet 0  . rosuvastatin (CRESTOR) 5 MG tablet TAKE 1 TABLET DAILY 90 tablet 1  . sitaGLIPtin-metformin (JANUMET) 50-500 MG tablet Take 1 tablet by mouth 2 (two) times daily with a meal. 180 tablet 0  . triamcinolone cream (KENALOG) 0.1 % Apply 1 application topically 2 (two) times daily. 30 g 0   No current facility-administered medications for this visit.     PHYSICAL EXAMINATION: ECOG PERFORMANCE STATUS: 0 - Asymptomatic  Vitals:   02/02/19 1321  BP: (!) 152/91  Pulse: 90  Resp: 18  Temp: 98.2 F (36.8 C)  SpO2: 100%   Filed Weights   02/02/19 1321  Weight: 181 lb 4.8 oz (82.2 kg)   GENERAL:alert, no distress and comfortable SKIN: skin color, texture, turgor are normal, no rashes or significant lesions EYES: normal, Conjunctiva are pink and non-injected, sclera clear  NECK: supple, thyroid normal size, non-tender, without nodularity LYMPH:  no palpable lymphadenopathy in the cervical, axillary (+) palpable Very small 1x1.5cm right inguinal LN, moveable, non-tender LUNGS: clear to auscultation and percussion with normal breathing effort HEART: regular rate & rhythm and no murmurs and no lower extremity edema ABDOMEN:abdomen soft, non-tender and normal bowel sounds (+) No Organomegaly  Musculoskeletal:no cyanosis of digits and no clubbing  NEURO: alert & oriented x 3 with fluent speech, no focal motor/sensory deficits  LABORATORY DATA:  I have reviewed the data as listed CBC Latest Ref Rng & Units 02/02/2019 07/17/2018 04/10/2018  WBC 4.0 - 10.5 K/uL 7.0 6.9 6.7  Hemoglobin 13.0 - 17.0 g/dL 47.815.9 29.515.5 62.115.9  Hematocrit 39.0 - 52.0 % 49.4 47.5  48.2  Platelets 150 - 400 K/uL 313 284 300     CMP Latest Ref Rng & Units 02/02/2019 07/17/2018 04/10/2018  Glucose 70 - 99 mg/dL 89 131(H) 161(H)  BUN 6 - 20 mg/dL 11 10 9   Creatinine 0.61 - 1.24 mg/dL 1.03 1.02 0.91  Sodium 135 - 145 mmol/L 139 141 141  Potassium 3.5 -  5.1 mmol/L 4.5 4.7 5.2(H)  Chloride 98 - 111 mmol/L 103 102 107  CO2 22 - 32 mmol/L 25 28 24   Calcium 8.9 - 10.3 mg/dL 10.2 10.2 9.1  Total Protein 6.5 - 8.1 g/dL 7.7 7.1 8.0  Total Bilirubin 0.3 - 1.2 mg/dL 0.7 0.6 0.4  Alkaline Phos 38 - 126 U/L 61 52 56  AST 15 - 41 U/L 24 20 22   ALT 0 - 44 U/L 27 24 24       RADIOGRAPHIC STUDIES: I have personally reviewed the radiological images as listed and agreed with the findings in the report. No results found.   ASSESSMENT & PLAN:  Martin Sanchez is a 43 y.o. male with   1. Right inguinal and pelvic lymphadenopathy s/p biopsy 05/24/2017 withflorid lymphoid hyperplasia -He initially presented with a palpable right inguinal lymphadenopathy, surgical biopsy of the lymph nodes was benign, negative for lymphoma. -His 07/17/18 CT scan showed he developed multiple mildly enlarged pelvic lymph nodes, no palpable peripheral adenopathy. We discussed her that this is likely benign process, although low grade lymphomas cannot be ruled out. I do not recommend biopsy at this point. -He is clinically doing well. He has purposefully lost weight with proper diet and exercise. Labs reviewed, CBC and CMP WNL. LDH still pending. Physical exam there was no organomegaly or adenopathy except a palpable very small 1x1.5cm right inguinal LN, moveable, non-tender. I discussed this could be reactive.  -F/u in 6 months with CT scan. If stable on next scan I will discharge him.  -He will watch for concerning symptoms of lymphoma such as drenching sweats, fever, chills or unexpected significant weight loss.   2. HTN, DM -f/u with PCP and continue recommended medications -He sees his PCP every 6 months -DM improved with recent weight loss.  -BP at 152/91, BG 89 today (02/02/19).   Plan -Lab and f/u in 6 months with CT AP W Contrast a few days before.    No problem-specific Assessment & Plan notes found for this encounter.   Orders Placed This Encounter   Procedures  . CT Abdomen Pelvis W Contrast    Standing Status:   Future    Standing Expiration Date:   02/02/2020    Order Specific Question:   If indicated for the ordered procedure, I authorize the administration of contrast media per Radiology protocol    Answer:   Yes    Order Specific Question:   Preferred imaging location?    Answer:   [facility]    Order Specific Question:   Is Oral Contrast requested for this exam?    Answer:   Yes, Per Radiology protocol    Order Specific Question:   Radiology Contrast Protocol - do NOT remove file path    Answer:   [email or web address]   All questions were answered. The patient knows to call the clinic with any problems, questions or concerns. No barriers to learning was detected. I spent 15 minutes counseling the patient face to face. The total time spent in the appointment was 20 minutes and more than 50% was on counseling and review of test  results     Malachy MoodYan Xoie Kreuser, MD 02/02/2019   I, Delphina CahillAmoya Bennett, am acting as scribe for Malachy MoodYan Rashada Klontz, MD.   I have reviewed the above documentation for accuracy and completeness, and I agree with the above.

## 2019-02-02 ENCOUNTER — Inpatient Hospital Stay (HOSPITAL_BASED_OUTPATIENT_CLINIC_OR_DEPARTMENT_OTHER): Payer: BC Managed Care – PPO | Admitting: Hematology

## 2019-02-02 ENCOUNTER — Other Ambulatory Visit: Payer: Self-pay

## 2019-02-02 ENCOUNTER — Encounter: Payer: Self-pay | Admitting: Hematology

## 2019-02-02 ENCOUNTER — Inpatient Hospital Stay: Payer: BC Managed Care – PPO | Attending: Hematology

## 2019-02-02 VITALS — BP 152/91 | HR 90 | Temp 98.2°F | Resp 18 | Ht 67.75 in | Wt 181.3 lb

## 2019-02-02 DIAGNOSIS — R1907 Generalized intra-abdominal and pelvic swelling, mass and lump: Secondary | ICD-10-CM | POA: Diagnosis not present

## 2019-02-02 DIAGNOSIS — R591 Generalized enlarged lymph nodes: Secondary | ICD-10-CM | POA: Diagnosis not present

## 2019-02-02 DIAGNOSIS — E119 Type 2 diabetes mellitus without complications: Secondary | ICD-10-CM

## 2019-02-02 DIAGNOSIS — I10 Essential (primary) hypertension: Secondary | ICD-10-CM | POA: Insufficient documentation

## 2019-02-02 DIAGNOSIS — R599 Enlarged lymph nodes, unspecified: Secondary | ICD-10-CM | POA: Insufficient documentation

## 2019-02-02 DIAGNOSIS — Z7984 Long term (current) use of oral hypoglycemic drugs: Secondary | ICD-10-CM | POA: Diagnosis not present

## 2019-02-02 DIAGNOSIS — R590 Localized enlarged lymph nodes: Secondary | ICD-10-CM

## 2019-02-02 LAB — CBC WITH DIFFERENTIAL (CANCER CENTER ONLY)
Abs Immature Granulocytes: 0.03 10*3/uL (ref 0.00–0.07)
Basophils Absolute: 0.1 10*3/uL (ref 0.0–0.1)
Basophils Relative: 1 %
Eosinophils Absolute: 0.2 10*3/uL (ref 0.0–0.5)
Eosinophils Relative: 3 %
HCT: 49.4 % (ref 39.0–52.0)
Hemoglobin: 15.9 g/dL (ref 13.0–17.0)
Immature Granulocytes: 0 %
Lymphocytes Relative: 33 %
Lymphs Abs: 2.3 10*3/uL (ref 0.7–4.0)
MCH: 26.7 pg (ref 26.0–34.0)
MCHC: 32.2 g/dL (ref 30.0–36.0)
MCV: 82.9 fL (ref 80.0–100.0)
Monocytes Absolute: 0.5 10*3/uL (ref 0.1–1.0)
Monocytes Relative: 7 %
Neutro Abs: 3.9 10*3/uL (ref 1.7–7.7)
Neutrophils Relative %: 56 %
Platelet Count: 313 10*3/uL (ref 150–400)
RBC: 5.96 MIL/uL — ABNORMAL HIGH (ref 4.22–5.81)
RDW: 12.9 % (ref 11.5–15.5)
WBC Count: 7 10*3/uL (ref 4.0–10.5)
nRBC: 0 % (ref 0.0–0.2)

## 2019-02-02 LAB — CMP (CANCER CENTER ONLY)
ALT: 27 U/L (ref 0–44)
AST: 24 U/L (ref 15–41)
Albumin: 4.5 g/dL (ref 3.5–5.0)
Alkaline Phosphatase: 61 U/L (ref 38–126)
Anion gap: 11 (ref 5–15)
BUN: 11 mg/dL (ref 6–20)
CO2: 25 mmol/L (ref 22–32)
Calcium: 10.2 mg/dL (ref 8.9–10.3)
Chloride: 103 mmol/L (ref 98–111)
Creatinine: 1.03 mg/dL (ref 0.61–1.24)
GFR, Est AFR Am: >60 mL/min (ref >60)
GFR, Estimated: >60 mL/min (ref >60)
Glucose, Bld: 89 mg/dL (ref 70–99)
Potassium: 4.5 mmol/L (ref 3.5–5.1)
Sodium: 139 mmol/L (ref 135–145)
Total Bilirubin: 0.7 mg/dL (ref 0.3–1.2)
Total Protein: 7.7 g/dL (ref 6.5–8.1)

## 2019-02-02 LAB — LACTATE DEHYDROGENASE: LDH: 160 U/L (ref 98–192)

## 2019-02-05 ENCOUNTER — Telehealth: Payer: Self-pay | Admitting: Hematology

## 2019-02-05 ENCOUNTER — Telehealth: Payer: Self-pay

## 2019-02-05 NOTE — Telephone Encounter (Signed)
Scheduled appt per 6/12 los. Mailed calendar. °

## 2019-02-05 NOTE — Telephone Encounter (Signed)
Spoke with patient regarding lab results.  Per Dr. Burr Medico CBC CMP all wnl.  Patient verbalized an understanding.

## 2019-03-26 ENCOUNTER — Ambulatory Visit: Payer: Self-pay | Admitting: Family Medicine

## 2019-04-18 DIAGNOSIS — M25572 Pain in left ankle and joints of left foot: Secondary | ICD-10-CM | POA: Diagnosis not present

## 2019-05-14 ENCOUNTER — Ambulatory Visit (INDEPENDENT_AMBULATORY_CARE_PROVIDER_SITE_OTHER): Payer: BC Managed Care – PPO

## 2019-05-14 ENCOUNTER — Other Ambulatory Visit: Payer: Self-pay

## 2019-05-14 DIAGNOSIS — Z23 Encounter for immunization: Secondary | ICD-10-CM

## 2019-07-29 ENCOUNTER — Other Ambulatory Visit: Payer: Self-pay | Admitting: Family Medicine

## 2019-07-30 MED ORDER — LOSARTAN POTASSIUM 50 MG PO TABS: 75.0000 mg | ORAL_TABLET | Freq: Every day | ORAL | 0 refills | Status: DC

## 2019-07-30 MED ORDER — JANUMET 50-500 MG PO TABS: 1.0000 | ORAL_TABLET | Freq: Two times a day (BID) | ORAL | 0 refills | Status: DC

## 2019-07-31 ENCOUNTER — Other Ambulatory Visit: Payer: Self-pay | Admitting: Family Medicine

## 2019-08-01 ENCOUNTER — Other Ambulatory Visit: Payer: Self-pay

## 2019-08-01 DIAGNOSIS — R599 Enlarged lymph nodes, unspecified: Secondary | ICD-10-CM

## 2019-08-01 DIAGNOSIS — R591 Generalized enlarged lymph nodes: Secondary | ICD-10-CM

## 2019-08-02 ENCOUNTER — Inpatient Hospital Stay: Payer: BC Managed Care – PPO | Attending: Hematology

## 2019-08-02 ENCOUNTER — Ambulatory Visit (HOSPITAL_COMMUNITY)
Admission: RE | Admit: 2019-08-02 | Discharge: 2019-08-02 | Disposition: A | Discharge status: Home / Self Care | Payer: BC Managed Care – PPO | Source: Ambulatory Visit | Attending: Hematology | Admitting: Hematology

## 2019-08-02 ENCOUNTER — Other Ambulatory Visit: Payer: Self-pay

## 2019-08-02 ENCOUNTER — Encounter (HOSPITAL_COMMUNITY): Payer: Self-pay

## 2019-08-02 DIAGNOSIS — K219 Gastro-esophageal reflux disease without esophagitis: Secondary | ICD-10-CM | POA: Insufficient documentation

## 2019-08-02 DIAGNOSIS — R591 Generalized enlarged lymph nodes: Secondary | ICD-10-CM | POA: Diagnosis not present

## 2019-08-02 DIAGNOSIS — Z806 Family history of leukemia: Secondary | ICD-10-CM | POA: Diagnosis not present

## 2019-08-02 DIAGNOSIS — R1907 Generalized intra-abdominal and pelvic swelling, mass and lump: Secondary | ICD-10-CM

## 2019-08-02 DIAGNOSIS — E119 Type 2 diabetes mellitus without complications: Secondary | ICD-10-CM | POA: Insufficient documentation

## 2019-08-02 DIAGNOSIS — R635 Abnormal weight gain: Secondary | ICD-10-CM | POA: Insufficient documentation

## 2019-08-02 DIAGNOSIS — R599 Enlarged lymph nodes, unspecified: Secondary | ICD-10-CM

## 2019-08-02 DIAGNOSIS — K5900 Constipation, unspecified: Secondary | ICD-10-CM | POA: Diagnosis not present

## 2019-08-02 DIAGNOSIS — I10 Essential (primary) hypertension: Secondary | ICD-10-CM | POA: Insufficient documentation

## 2019-08-02 DIAGNOSIS — R590 Localized enlarged lymph nodes: Secondary | ICD-10-CM | POA: Diagnosis not present

## 2019-08-02 LAB — CBC WITH DIFFERENTIAL (CANCER CENTER ONLY)
Abs Immature Granulocytes: 0.02 10*3/uL (ref 0.00–0.07)
Basophils Absolute: 0.1 10*3/uL (ref 0.0–0.1)
Basophils Relative: 1 %
Eosinophils Absolute: 0.3 10*3/uL (ref 0.0–0.5)
Eosinophils Relative: 3 %
HCT: 48.4 % (ref 39.0–52.0)
Hemoglobin: 16.1 g/dL (ref 13.0–17.0)
Immature Granulocytes: 0 %
Lymphocytes Relative: 32 %
Lymphs Abs: 2.6 10*3/uL (ref 0.7–4.0)
MCH: 26.8 pg (ref 26.0–34.0)
MCHC: 33.3 g/dL (ref 30.0–36.0)
MCV: 80.7 fL (ref 80.0–100.0)
Monocytes Absolute: 0.5 10*3/uL (ref 0.1–1.0)
Monocytes Relative: 6 %
Neutro Abs: 4.6 10*3/uL (ref 1.7–7.7)
Neutrophils Relative %: 58 %
Platelet Count: 295 10*3/uL (ref 150–400)
RBC: 6 MIL/uL — ABNORMAL HIGH (ref 4.22–5.81)
RDW: 13.1 % (ref 11.5–15.5)
WBC Count: 8 10*3/uL (ref 4.0–10.5)
nRBC: 0 % (ref 0.0–0.2)

## 2019-08-02 LAB — CMP (CANCER CENTER ONLY)
ALT: 27 U/L (ref 0–44)
AST: 26 U/L (ref 15–41)
Albumin: 4.4 g/dL (ref 3.5–5.0)
Alkaline Phosphatase: 61 U/L (ref 38–126)
Anion gap: 10 (ref 5–15)
BUN: 10 mg/dL (ref 6–20)
CO2: 29 mmol/L (ref 22–32)
Calcium: 9.7 mg/dL (ref 8.9–10.3)
Chloride: 101 mmol/L (ref 98–111)
Creatinine: 1.02 mg/dL (ref 0.61–1.24)
GFR, Est AFR Am: >60 mL/min (ref >60)
GFR, Estimated: >60 mL/min (ref >60)
Glucose, Bld: 108 mg/dL — ABNORMAL HIGH (ref 70–99)
Potassium: 4.2 mmol/L (ref 3.5–5.1)
Sodium: 140 mmol/L (ref 135–145)
Total Bilirubin: 0.6 mg/dL (ref 0.3–1.2)
Total Protein: 7.7 g/dL (ref 6.5–8.1)

## 2019-08-02 LAB — LACTATE DEHYDROGENASE: LDH: 153 U/L (ref 98–192)

## 2019-08-02 MED ORDER — SODIUM CHLORIDE (PF) 0.9 % IJ SOLN
INTRAMUSCULAR | Status: AC
  Filled 2019-08-02: qty 50

## 2019-08-02 MED ORDER — IOHEXOL 300 MG/ML  SOLN
100.0000 mL | Freq: Once | INTRAMUSCULAR | Status: AC | PRN
  Administered 2019-08-02: 100 mL via INTRAVENOUS

## 2019-08-02 NOTE — Progress Notes (Signed)
[facility]   Telephone:(336) [phone] Fax:(336) [phone]   Clinic Follow up Note   Patient Care Team: Terressa KoyanagiKim, Hannah R, DO as PCP - General (Family Medicine)  Date of Service:  08/06/2019  CHIEF COMPLAINT: F/u on lymphadenopathy   CURRENT THERAPY:  Observation   INTERVAL HISTORY:  Martin Sanchez is here for a follow up lymphadenopathy. He was last seen by me 6 months ago. He presents to the clinic alone. He notes he is overall doing well. He noted having acid reflux and constipation. He has used omeprazole and Miralax. He notes he still feels his right groin LN which has not changed. He notes he has gained weight after recent festival. He notes he still walks when not too cold. He notes his father was recently diagnosed with CLL.     REVIEW OF SYSTEMS:   Constitutional: Denies fevers, chills (+) weight gain  Eyes: Denies blurriness of vision Ears, nose, mouth, throat, and face: Denies mucositis or sore throat Respiratory: Denies cough, dyspnea or wheezes Cardiovascular: Denies palpitation, chest discomfort or lower extremity swelling Gastrointestinal:  Denies nausea (+) Mild acid reflux and constipation Skin: Denies abnormal skin rashes Lymphatics: Denies new lymphadenopathy or easy bruising (+) stable palpable right groin LN Neurological:Denies numbness, tingling or new weaknesses Behavioral/Psych: Mood is stable, no new changes  All other systems were reviewed with the patient and are negative.  MEDICAL HISTORY:  Past Medical History:  Diagnosis Date  . BMI 28.0-28.9,adult 06/11/2016  . Essential hypertension 06/11/2016  . Hyperlipemia 03/08/2016  . Type 2 diabetes mellitus without complication, without long-term current use of insulin (HCC) 03/08/2016    SURGICAL HISTORY: Past Surgical History:  Procedure Laterality Date  . LYMPH NODE BIOPSY Right 05/13/2017    I have reviewed the social history and family history with the patient and they are unchanged  from previous note.  ALLERGIES:  has No Known Allergies.  MEDICATIONS:  Current Outpatient Medications  Medication Sig Dispense Refill  . losartan (COZAAR) 50 MG tablet Take 1.5 tablets (75 mg total) by mouth daily. 135 tablet 0  . rosuvastatin (CRESTOR) 5 MG tablet TAKE 1 TABLET DAILY 90 tablet 1  . sitaGLIPtin-metformin (JANUMET) 50-500 MG tablet Take 1 tablet by mouth 2 (two) times daily with a meal. 180 tablet 0  . triamcinolone cream (KENALOG) 0.1 % APPLY 1 APPLICATION TOPICALLY TWICE A DAY 30 g 0   No current facility-administered medications for this visit.    PHYSICAL EXAMINATION: ECOG PERFORMANCE STATUS: 0 - Asymptomatic  Vitals:   08/06/19 1132  BP: (!) 144/96  Pulse: 94  Resp: 20  Temp: 97.8 F (36.6 C)  SpO2: 100%   Filed Weights   08/06/19 1132  Weight: 196 lb 1.6 oz (89 kg)    GENERAL:alert, no distress and comfortable SKIN: skin color, texture, turgor are normal, no rashes or significant lesions EYES: normal, Conjunctiva are pink and non-injected, sclera clear  NECK: supple, thyroid normal size, non-tender, without nodularity LYMPH:  no palpable lymphadenopathy in the cervical, axillary or (+) 0.8-1cm palpable right inguinal LN LUNGS: clear to auscultation and percussion with normal breathing effort HEART: regular rate & rhythm and no murmurs and no lower extremity edema ABDOMEN:abdomen soft, non-tender and normal bowel sounds Musculoskeletal:no cyanosis of digits and no clubbing  NEURO: alert & oriented x 3 with fluent speech, no focal motor/sensory deficits  LABORATORY DATA:  I have reviewed the data as listed CBC Latest Ref Rng & Units 08/02/2019 02/02/2019 07/17/2018  WBC 4.0 -  10.5 K/uL 8.0 7.0 6.9  Hemoglobin 13.0 - 17.0 g/dL 16.1 15.9 15.5  Hematocrit 39.0 - 52.0 % 48.4 49.4 47.5  Platelets 150 - 400 K/uL 295 313 284     CMP Latest Ref Rng & Units 08/02/2019 02/02/2019 07/17/2018  Glucose 70 - 99 mg/dL 108(H) 89 131(H)  BUN 6 - 20 mg/dL 10 11  10   Creatinine 0.61 - 1.24 mg/dL 1.02 1.03 1.02  Sodium 135 - 145 mmol/L 140 139 141  Potassium 3.5 - 5.1 mmol/L 4.2 4.5 4.7  Chloride 98 - 111 mmol/L 101 103 102  CO2 22 - 32 mmol/L 29 25 28   Calcium 8.9 - 10.3 mg/dL 9.7 10.2 10.2  Total Protein 6.5 - 8.1 g/dL 7.7 7.7 7.1  Total Bilirubin 0.3 - 1.2 mg/dL 0.6 0.7 0.6  Alkaline Phos 38 - 126 U/L 61 61 52  AST 15 - 41 U/L 26 24 20   ALT 0 - 44 U/L 27 27 24     CT AP W Contrast 08/02/19   IMPRESSION: 1. No substantial change in the right pelvic sidewall lymphadenopathy. There is 1 particular right external iliac node that is mildly progressed from 7 mm to 11 mm, but the remaining index lymph nodes along the right pelvic sidewall are similar to prior. 2. Otherwise no acute findings in the abdomen/pelvis.    RADIOGRAPHIC STUDIES: I have personally reviewed the radiological images as listed and agreed with the findings in the report. No results found.   ASSESSMENT & PLAN:  Martin Sanchez is a 43 y.o. male with   1. Right inguinaland pelviclymphadenopathy s/p biopsy 05/24/2017 withflorid lymphoid hyperplasia -Heinitially presented with a palpable right inguinal lymphadenopathy, surgical biopsy of the lymph nodes showed florid lymphoid hyperplasia, negative for lymphoma in 05/2017.  -His 07/17/18 CT scan showed he developed multiple mildly enlarged pelvic lymph nodes, no palpable peripheral adenopathy except a 1cm right inguinal lymph node. We previously discussed her that this is likely benign process, although low gradelymphomas cannot be ruled out. I do not recommend biopsy at this point. -I personally reviewed and discussed his CT AP from 08/02/19 which shows mostly stable lymph nodes in the pelvis. One LN has mildly increased from before.  -I discussed given scans there has been minimal growth of a few LNs in the past 2 years, mostly stable. I discussed we can continue observation with one more scan in 2 years. He is agreeable. I  encouraged him to watch for LN in groin, neck or axilla, significant unexpected weight loss or fatigue, and persistent or recurrent fever which are common symptoms of lymphoma.  -He is otherwise clinically stable, asymptomatic with recent moderate weight gain. He has recent constipation and acid reflux managed with omeprazole and Miralax. Labs reviewed from last week, CBC, CMP and LDH overall WNL. Physical exam today shows stable right groin LN 0.8-1cm. No indication of treatment or biopsy at this time.  -F/u in 1 year with me.    2. HTN, DM -f/u with PCP q70months and continue recommended medications. DM improved with prior weight loss.  -He has gained weight recently. I encouraged him to increase exercise again to manage his weight.   Plan -He is clinically stable overall, asymptomatic. I reviewed his lab and scan  -Lab and f/u in 1 year, may repeat CT abd/pel in 2 years  -will review his case in hem board    No problem-specific Assessment & Plan notes found for this encounter.   No orders of the defined types  were placed in this encounter.  All questions were answered. The patient knows to call the clinic with any problems, questions or concerns. No barriers to learning was detected. I spent 20 minutes counseling the patient face to face. The total time spent in the appointment was 25 minutes and more than 50% was on counseling and review of test results     Malachy Mood, MD 08/06/2019   I, Delphina Cahill, am acting as scribe for Malachy Mood, MD.   I have reviewed the above documentation for accuracy and completeness, and I agree with the above.

## 2019-08-06 ENCOUNTER — Other Ambulatory Visit: Payer: Self-pay

## 2019-08-06 ENCOUNTER — Telehealth: Payer: Self-pay | Admitting: Hematology

## 2019-08-06 ENCOUNTER — Inpatient Hospital Stay (HOSPITAL_BASED_OUTPATIENT_CLINIC_OR_DEPARTMENT_OTHER): Payer: BC Managed Care – PPO | Admitting: Hematology

## 2019-08-06 ENCOUNTER — Encounter: Payer: Self-pay | Admitting: Hematology

## 2019-08-06 VITALS — BP 144/96 | HR 94 | Temp 97.8°F | Resp 20 | Ht 67.75 in | Wt 196.1 lb

## 2019-08-06 DIAGNOSIS — Z806 Family history of leukemia: Secondary | ICD-10-CM | POA: Diagnosis not present

## 2019-08-06 DIAGNOSIS — R591 Generalized enlarged lymph nodes: Secondary | ICD-10-CM | POA: Diagnosis not present

## 2019-08-06 DIAGNOSIS — K219 Gastro-esophageal reflux disease without esophagitis: Secondary | ICD-10-CM | POA: Diagnosis not present

## 2019-08-06 DIAGNOSIS — E119 Type 2 diabetes mellitus without complications: Secondary | ICD-10-CM | POA: Diagnosis not present

## 2019-08-06 DIAGNOSIS — R599 Enlarged lymph nodes, unspecified: Secondary | ICD-10-CM

## 2019-08-06 DIAGNOSIS — R635 Abnormal weight gain: Secondary | ICD-10-CM | POA: Diagnosis not present

## 2019-08-06 DIAGNOSIS — K5900 Constipation, unspecified: Secondary | ICD-10-CM | POA: Diagnosis not present

## 2019-08-06 DIAGNOSIS — I10 Essential (primary) hypertension: Secondary | ICD-10-CM | POA: Diagnosis not present

## 2019-08-06 NOTE — Telephone Encounter (Signed)
Scheduled appt per 12/14 los.  Patient declined calendar and avs,

## 2020-01-22 ENCOUNTER — Other Ambulatory Visit: Payer: Self-pay | Admitting: Family Medicine

## 2020-01-22 MED ORDER — LOSARTAN POTASSIUM 50 MG PO TABS: 75.0000 mg | ORAL_TABLET | Freq: Every day | ORAL | 0 refills | Status: DC

## 2020-01-22 MED ORDER — JANUMET 50-500 MG PO TABS: 1.0000 | ORAL_TABLET | Freq: Two times a day (BID) | ORAL | 0 refills | Status: DC

## 2020-01-25 ENCOUNTER — Encounter: Payer: Self-pay | Admitting: Family Medicine

## 2020-01-25 ENCOUNTER — Other Ambulatory Visit: Payer: Self-pay | Admitting: Family Medicine

## 2020-01-25 ENCOUNTER — Other Ambulatory Visit: Payer: Self-pay | Admitting: *Deleted

## 2020-01-25 MED ORDER — TRIAMCINOLONE ACETONIDE 0.1 % EX CREA: TOPICAL_CREAM | Freq: Two times a day (BID) | CUTANEOUS | 0 refills | Status: DC

## 2020-01-25 MED ORDER — ROSUVASTATIN CALCIUM 5 MG PO TABS: ORAL_TABLET | ORAL | 0 refills | Status: DC

## 2020-01-25 NOTE — Telephone Encounter (Signed)
Rx done. 

## 2020-01-25 NOTE — Telephone Encounter (Signed)
Pt want a refill on rosuvastatin (CRESTOR) 5 MG tablet sent to  [facility] [place], [place] - [address] Phone:  [phone]  Fax:  [phone]

## 2020-01-28 MED ORDER — ROSUVASTATIN CALCIUM 5 MG PO TABS: ORAL_TABLET | ORAL | 0 refills | Status: DC

## 2020-01-28 MED ORDER — JANUMET 50-500 MG PO TABS: 1.0000 | ORAL_TABLET | Freq: Two times a day (BID) | ORAL | 0 refills | Status: DC

## 2020-01-28 NOTE — Telephone Encounter (Signed)
Prior auth sent to [email or web address] [id number] and patient informed via Mychart message.

## 2020-01-31 NOTE — Telephone Encounter (Signed)
Pt has not been seen in a long time. Please schedule visit - ok to do telemed visit with me is he wishes. He needs labs, check up and can discuss this then. Looks like his insurance is saying they will not cover his janumet. Have him check to see what diabetes medications they DO cover before his appt so that we can send in an rx if needed to get him to the appt to establish with Dr. Hassan Rowan.  Thanks!

## 2020-01-31 NOTE — Telephone Encounter (Signed)
Fax received from [employer] stating the request was denied due to lack of information and the medications that the pt has tried was listed when the prior auth was submitted.  Message sent to PCP.

## 2020-03-06 ENCOUNTER — Other Ambulatory Visit: Payer: Self-pay | Admitting: Family Medicine

## 2020-03-28 ENCOUNTER — Other Ambulatory Visit: Payer: Self-pay

## 2020-03-28 ENCOUNTER — Ambulatory Visit (INDEPENDENT_AMBULATORY_CARE_PROVIDER_SITE_OTHER): Payer: 59 | Admitting: Family Medicine

## 2020-03-28 ENCOUNTER — Encounter: Payer: Self-pay | Admitting: Family Medicine

## 2020-03-28 ENCOUNTER — Telehealth: Payer: Self-pay | Admitting: Family Medicine

## 2020-03-28 VITALS — BP 168/100 | HR 78 | Temp 98.0°F | Ht 67.75 in | Wt 187.7 lb

## 2020-03-28 DIAGNOSIS — E1159 Type 2 diabetes mellitus with other circulatory complications: Secondary | ICD-10-CM | POA: Diagnosis not present

## 2020-03-28 DIAGNOSIS — I10 Essential (primary) hypertension: Secondary | ICD-10-CM

## 2020-03-28 DIAGNOSIS — E1169 Type 2 diabetes mellitus with other specified complication: Secondary | ICD-10-CM

## 2020-03-28 DIAGNOSIS — E119 Type 2 diabetes mellitus without complications: Secondary | ICD-10-CM

## 2020-03-28 DIAGNOSIS — E785 Hyperlipidemia, unspecified: Secondary | ICD-10-CM

## 2020-03-28 DIAGNOSIS — I152 Hypertension secondary to endocrine disorders: Secondary | ICD-10-CM

## 2020-03-28 DIAGNOSIS — R238 Other skin changes: Secondary | ICD-10-CM

## 2020-03-28 DIAGNOSIS — R590 Localized enlarged lymph nodes: Secondary | ICD-10-CM

## 2020-03-28 MED ORDER — LOSARTAN POTASSIUM 100 MG PO TABS: 100.0000 mg | ORAL_TABLET | Freq: Every day | ORAL | 3 refills | Status: DC

## 2020-03-28 MED ORDER — FLUOCINOLONE ACETONIDE SCALP 0.01 % EX OIL: 1.0000 "application " | TOPICAL_OIL | Freq: Every day | CUTANEOUS | 2 refills | Status: AC | PRN

## 2020-03-28 MED ORDER — FLUOCINOLONE ACETONIDE SCALP 0.01 % EX OIL: 1.0000 "application " | TOPICAL_OIL | Freq: Every day | CUTANEOUS | 2 refills | Status: DC | PRN

## 2020-03-28 NOTE — Telephone Encounter (Signed)
We had increased losartan dose but he didn't want me to send in dose yet, so I had taken off but I will put back on list.

## 2020-03-28 NOTE — Telephone Encounter (Signed)
Patient stopped by the front desk and stated his Losartan isn't on his medication list and wants it added back to his list.

## 2020-03-28 NOTE — Telephone Encounter (Signed)
Message sent via Mychart message. 

## 2020-03-28 NOTE — Progress Notes (Signed)
Martin Sanchez DOB: [birth year] Encounter date: 03/28/2020  This is a 44 y.o. male who presents to establish care. Chief Complaint  Patient presents with   Establish Care    History of present illness: DMII: janumet. Doesn't check glucose at home. He is compliant with this medication. No issues with hypoglycemia; only jittery if he doesn't eat.   HL: crestor 5mg . Tolerates this well.   HTN: hasn't been checking at home regularly. Did miss some doses while traveling in last few weeks.   Elevated liver enzymes: improved when alcohol stopped  Lymphadenopathy: following with oncology  Does use triamcinolone for dry patches. Just on occasion that this occurs.   Occasional acid reflux/burping. Takes omeprazole on occasion.   Uses zyrtec just if needed for allergies.   Past Medical History:  Diagnosis Date   BMI 28.0-28.9,adult 06/11/2016   Essential hypertension 06/11/2016   Hyperlipemia 03/08/2016   Type 2 diabetes mellitus without complication, without long-term current use of insulin (HCC) 03/08/2016   Past Surgical History:  Procedure Laterality Date   LYMPH NODE BIOPSY Right 05/13/2017   No Known Allergies Current Meds  Medication Sig   rosuvastatin (CRESTOR) 5 MG tablet TAKE 1 TABLET BY MOUTH  DAILY   sitaGLIPtin-metformin (JANUMET) 50-500 MG tablet Take 1 tablet by mouth 2 (two) times daily with a meal.   triamcinolone cream (KENALOG) 0.1 % Apply topically 2 (two) times daily. APPLY 1 APPLICATION TOPICALLY TWICE A DAY   [DISCONTINUED] losartan (COZAAR) 50 MG tablet Take 1.5 tablets (75 mg total) by mouth daily.   Social History   Tobacco Use   Smoking status: Never Smoker   Smokeless tobacco: Never Used  Substance Use Topics   Alcohol use: No   Family History  Problem Relation Age of Onset   Diabetes Father    Leukemia Father        CLL   High blood pressure Mother    High blood pressure Brother      Review of Systems  Constitutional:  Negative for chills, fatigue and fever.  Respiratory: Negative for cough, chest tightness, shortness of breath and wheezing.   Cardiovascular: Negative for chest pain, palpitations and leg swelling.  Gastrointestinal: Negative for abdominal pain, constipation, diarrhea, nausea and vomiting.       Increased gas    Objective:  BP (!) 140/100 (BP Location: Left Arm, Patient Position: Sitting, Cuff Size: Normal)    Pulse 78    Temp 98 F (36.7 C) (Oral)    Ht 5' 7.75" (1.721 m)    Wt 187 lb 11.2 oz (85.1 kg)    BMI 28.75 kg/m   Weight: 187 lb 11.2 oz (85.1 kg)   BP Readings from Last 3 Encounters:  03/28/20 (!) 140/100  08/06/19 (!) 144/96  02/02/19 (!) 152/91   Wt Readings from Last 3 Encounters:  03/28/20 187 lb 11.2 oz (85.1 kg)  08/06/19 196 lb 1.6 oz (89 kg)  02/02/19 181 lb 4.8 oz (82.2 kg)    Physical Exam Constitutional:      General: He is not in acute distress.    Appearance: He is well-developed.  HENT:     Head: Normocephalic and atraumatic.  Cardiovascular:     Rate and Rhythm: Normal rate and regular rhythm.     Heart sounds: Normal heart sounds. No murmur heard.   Pulmonary:     Effort: Pulmonary effort is normal.     Breath sounds: Normal breath sounds.  Abdominal:     General: Bowel  sounds are normal. There is no distension.     Palpations: Abdomen is soft.     Tenderness: There is no abdominal tenderness. There is no guarding.  Skin:    General: Skin is warm and dry.     Comments: Sensory exam of the foot is normal, tested with the monofilament. Good pulses, no lesions or ulcers, good peripheral pulses.  Psychiatric:        Judgment: Judgment normal.     Assessment/Plan:  1. Type 2 diabetes mellitus without complication, without long-term current use of insulin (HCC) Continue with the janumet. Keep up with exercise as this will help sugars. Further recommendations and med refills pending bloodwork today.  - Hemoglobin A1c; Future - HM DIABETES FOOT  EXAM; Future - Microalbumin / creatinine urine ratio; Future  2. Hypertension associated with diabetes (HCC) Elevated in office. Patient states never low, although always higher at doc. Will increase losartan to 100mg  daily; follow up in 2 mo to recheck. He will have wife bring home cuff to next visit so we can be sure he is getting accurate readings at home. - CBC with Differential/Platelet; Future - Comprehensive metabolic panel; Future  3. Hyperlipidemia associated with type 2 diabetes mellitus (HCC) Continue with crestor; labs today and dose adjustment pending results. - Lipid panel; Future - TSH; Future  4. LAD (lymphadenopathy), inguinal Following with oncology; has been stable.   5. Dry scalp - CBC with Differential/Platelet; Future - Fluocinolone Acetonide Scalp 0.01 % OIL; Apply 1 application topically daily as needed.  Dispense: 118.28 mL; Refill: 2  Return in about 2 months (around 05/28/2020) for physical exam.  07/28/2020, MD

## 2020-03-28 NOTE — Addendum Note (Signed)
Addended by: Wynn Banker on: 03/28/2020 12:48 PM   Modules accepted: Orders

## 2020-03-28 NOTE — Addendum Note (Signed)
Addended by: Lerry Liner on: 03/28/2020 08:47 AM   Modules accepted: Orders

## 2020-03-29 ENCOUNTER — Other Ambulatory Visit: Payer: Self-pay | Admitting: Family Medicine

## 2020-03-29 LAB — COMPREHENSIVE METABOLIC PANEL
AG Ratio: 1.8 (calc) (ref 1.0–2.5)
ALT: 26 U/L (ref 9–46)
AST: 25 U/L (ref 10–40)
Albumin: 4.5 g/dL (ref 3.6–5.1)
Alkaline phosphatase (APISO): 58 U/L (ref 36–130)
BUN: 10 mg/dL (ref 7–25)
CO2: 24 mmol/L (ref 20–32)
Calcium: 9.5 mg/dL (ref 8.6–10.3)
Chloride: 102 mmol/L (ref 98–110)
Creat: 0.91 mg/dL (ref 0.60–1.35)
Globulin: 2.5 g/dL (calc) (ref 1.9–3.7)
Glucose, Bld: 122 mg/dL — ABNORMAL HIGH (ref 65–99)
Potassium: 5 mmol/L (ref 3.5–5.3)
Sodium: 139 mmol/L (ref 135–146)
Total Bilirubin: 0.5 mg/dL (ref 0.2–1.2)
Total Protein: 7 g/dL (ref 6.1–8.1)

## 2020-03-29 LAB — CBC WITH DIFFERENTIAL/PLATELET
Absolute Monocytes: 449 cells/uL (ref 200–950)
Basophils Absolute: 59 cells/uL (ref 0–200)
Basophils Relative: 0.9 %
Eosinophils Absolute: 277 cells/uL (ref 15–500)
Eosinophils Relative: 4.2 %
HCT: 47.5 % (ref 38.5–50.0)
Hemoglobin: 15.8 g/dL (ref 13.2–17.1)
Lymphs Abs: 2231 cells/uL (ref 850–3900)
MCH: 26.8 pg — ABNORMAL LOW (ref 27.0–33.0)
MCHC: 33.3 g/dL (ref 32.0–36.0)
MCV: 80.6 fL (ref 80.0–100.0)
MPV: 10.8 fL (ref 7.5–12.5)
Monocytes Relative: 6.8 %
Neutro Abs: 3584 cells/uL (ref 1500–7800)
Neutrophils Relative %: 54.3 %
Platelets: 324 10*3/uL (ref 140–400)
RBC: 5.89 10*6/uL — ABNORMAL HIGH (ref 4.20–5.80)
RDW: 13.6 % (ref 11.0–15.0)
Total Lymphocyte: 33.8 %
WBC: 6.6 10*3/uL (ref 3.8–10.8)

## 2020-03-29 LAB — MICROALBUMIN / CREATININE URINE RATIO
Creatinine, Urine: 26 mg/dL (ref 20–320)
Microalb, Ur: <0.2 mg/dL

## 2020-03-29 LAB — LIPID PANEL
Cholesterol: 194 mg/dL (ref <200)
HDL: 47 mg/dL (ref >=40)
LDL Cholesterol (Calc): 117 mg/dL (calc) — ABNORMAL HIGH
Non-HDL Cholesterol (Calc): 147 mg/dL (calc) — ABNORMAL HIGH (ref <130)
Total CHOL/HDL Ratio: 4.1 (calc) (ref <5.0)
Triglycerides: 189 mg/dL — ABNORMAL HIGH (ref <150)

## 2020-03-29 LAB — HEMOGLOBIN A1C
Hgb A1c MFr Bld: 7.5 % of total Hgb — ABNORMAL HIGH (ref <5.7)
Mean Plasma Glucose: 169 (calc)
eAG (mmol/L): 9.3 (calc)

## 2020-03-29 LAB — TSH: TSH: 2.65 mIU/L (ref 0.40–4.50)

## 2020-03-29 MED ORDER — ROSUVASTATIN CALCIUM 10 MG PO TABS: ORAL_TABLET | ORAL | 3 refills | Status: DC

## 2020-03-29 MED ORDER — JANUMET 50-500 MG PO TABS: 1.0000 | ORAL_TABLET | Freq: Two times a day (BID) | ORAL | 3 refills | Status: DC

## 2020-07-07 ENCOUNTER — Other Ambulatory Visit: Payer: Self-pay

## 2020-07-07 ENCOUNTER — Ambulatory Visit (INDEPENDENT_AMBULATORY_CARE_PROVIDER_SITE_OTHER): Payer: 59 | Admitting: *Deleted

## 2020-07-07 DIAGNOSIS — Z23 Encounter for immunization: Secondary | ICD-10-CM | POA: Diagnosis not present

## 2020-07-11 ENCOUNTER — Encounter: Payer: 59 | Admitting: Family Medicine

## 2020-07-25 NOTE — Progress Notes (Signed)
[facility]   Telephone:(336) [phone] Fax:(336) [phone]   Clinic Follow up Note   Patient Care Team: Martin Banker, MD as PCP - General (Family Medicine) Martin Mood, MD as Consulting Physician (Hematology)  Date of Service:  07/28/2020  CHIEF COMPLAINT: F/u on lymphadenopathy   CURRENT THERAPY:  Observation   INTERVAL HISTORY:  Martin Sanchez is here for a follow up of lymphadenopathy. He was last seen by me 1 year ago. He presents to the clinic alone. He notes he is doing well. He denies any new changes in the recent yeats. He has been having GERD in the past few months. He denies chills, sweats or fever. He denies feeling any lumps and masses. He notes after Flu shot he felt increase in size of left cervical LN. He notes he had his COVID series vaccine as well.     REVIEW OF SYSTEMS:   Constitutional: Denies fevers, chills or abnormal weight loss Eyes: Denies blurriness of vision Ears, nose, mouth, throat, and face: Denies mucositis or sore throat Respiratory: Denies cough, dyspnea or wheezes Cardiovascular: Denies palpitation, chest discomfort or lower extremity swelling Gastrointestinal:  Denies nausea, heartburn or change in bowel habits Skin: Denies abnormal skin rashes Lymphatics: Denies new lymphadenopathy or easy bruising Neurological:Denies numbness, tingling or new weaknesses Behavioral/Psych: Sanchez is stable, no new changes  All other systems were reviewed with the patient and are negative.  MEDICAL HISTORY:  Past Medical History:  Diagnosis Date  . BMI 28.0-28.9,adult 06/11/2016  . Essential hypertension 06/11/2016  . Hyperlipemia 03/08/2016  . Type 2 diabetes mellitus without complication, without long-term current use of insulin (HCC) 03/08/2016    SURGICAL HISTORY: Past Surgical History:  Procedure Laterality Date  . LYMPH NODE BIOPSY Right 05/13/2017    I have reviewed the social history and family history with the patient and they  are unchanged from previous note.  ALLERGIES:  has No Known Allergies.  MEDICATIONS:  Current Outpatient Medications  Medication Sig Dispense Refill  . Fluocinolone Acetonide Scalp 0.01 % OIL Apply 1 application topically daily as needed. 118.28 mL 2  . losartan (COZAAR) 100 MG tablet Take 1 tablet (100 mg total) by mouth daily. 90 tablet 3  . rosuvastatin (CRESTOR) 10 MG tablet TAKE 1 TABLET BY MOUTH  DAILY 90 tablet 3  . sitaGLIPtin-metformin (JANUMET) 50-500 MG tablet Take 1 tablet by mouth 2 (two) times daily with a meal. 180 tablet 3  . triamcinolone cream (KENALOG) 0.1 % Apply topically 2 (two) times daily. APPLY 1 APPLICATION TOPICALLY TWICE A DAY 30 g 0   No current facility-administered medications for this visit.    PHYSICAL EXAMINATION: ECOG PERFORMANCE STATUS: 0 - Asymptomatic  Vitals:   07/28/20 0831  BP: (!) 157/97  Pulse: 93  Resp: 15  Temp: 97.9 F (36.6 C)  SpO2: 100%   Filed Weights   07/28/20 0831  Weight: 187 lb 6.4 oz (85 kg)    GENERAL:alert, no distress and comfortable SKIN: skin color, texture, turgor are normal, no rashes or significant lesions EYES: normal, Conjunctiva are pink and non-injected, sclera clear  NECK: supple, thyroid normal size, non-tender, without nodularity LYMPH:  no palpable lymphadenopathy in the cervical, axillary or inguinal LUNGS: clear to auscultation and percussion with normal breathing effort HEART: regular rate & rhythm and no murmurs and no lower extremity edema ABDOMEN:abdomen soft, non-tender and normal bowel sounds Musculoskeletal:no cyanosis of digits and no clubbing  NEURO: alert & oriented x 3 with fluent speech, no  focal motor/sensory deficits  LABORATORY DATA:  I have reviewed the data as listed CBC Latest Ref Rng & Units 07/28/2020 03/28/2020 08/02/2019  WBC 4.0 - 10.5 K/uL 7.9 6.6 8.0  Hemoglobin 13.0 - 17.0 g/dL 06.2 37.6 28.3  Hematocrit 39 - 52 % 47.3 47.5 48.4  Platelets 150 - 400 K/uL 313 324 295      CMP Latest Ref Rng & Units 07/28/2020 03/28/2020 08/02/2019  Glucose 70 - 99 mg/dL 151(V) 616(W) 737(T)  BUN 6 - 20 mg/dL 9 10 10   Creatinine 0.61 - 1.24 mg/dL 0.62 6.94  Sodium 135 - 145 mmol/L 136 139 140  Potassium 3.5 - 5.1 mmol/L 4.6 5.0 4.2  Chloride 98 - 111 mmol/L 102 102 101  CO2 22 - 32 mmol/L 25 24 29   Calcium 8.9 - 10.3 mg/dL 8.54 9.5 9.7  Total Protein 6.5 - 8.1 g/dL 7.8 7.0 7.7  Total Bilirubin 0.3 - 1.2 mg/dL 0.5 0.5 0.6  Alkaline Phos 38 - 126 U/L 69 - 61  AST 15 - 41 U/L 26 25 26   ALT 0 - 44 U/L 39 26 27      RADIOGRAPHIC STUDIES: I have personally reviewed the radiological images as listed and agreed with the findings in the report. No results found.   ASSESSMENT & PLAN:  Martin Sanchez is a 44 y.o. male with    1. Right inguinaland pelviclymphadenopathy s/p biopsy 05/24/2017 withflorid lymphoid hyperplasia -Heinitially presented with a palpable right inguinal lymphadenopathy, surgical biopsy of the lymph nodes showed florid lymphoid hyperplasia,negative for lymphoma in 05/2017.  -His 11/25/19CT scan showed he developed multiplemildlyenlarged pelvic lymph nodes, no palpable peripheral adenopathy except a 1cm right inguinal lymph node. We previously discussed her that this is likely benign process, although low gradelymphomas cannot be ruled out. I do not recommend biopsy at this point. -His 08/01/20 CT AP showed mostly stable lymph nodes in the pelvis. One LN has mildly increased from before. There has only been minimal growth of a few LNs in the past 2 years, mostly stable. I discussed we can continue observation with one more scan in 2 years (2022). He is agreeable.  -He is clinically doing well and stable. He has no new concerning symptoms. Physical exam unremarkable, no palpable adenopathy I do not palpate prior lymphadenopathy. Labs reviewed, CBC and CMP WNL except BG 186. LDH is still pending.  -Continue observation. Will repeat last scan in  07/2021. I encouraged him to watch for LN in groin, neck or axilla, significant unexpected weight loss or fatigue, and persistent or recurrent fever which are common symptoms of lymphoma.  -f/u in 1 year.   2. HTN, DM, Acid Reflux -f/u with PCP q49months and continue recommended medications. -He plans to work on weight loss to help. I encouraged him to increase exercise again to manage his weight.   Plan -He is clinically stable overall, asymptomatic. I reviewed his lab and scan  -F/u in 1 year with lab and CT CAP w contrast a few days before.    No problem-specific Assessment & Plan notes found for this encounter.   Orders Placed This Encounter  Procedures  . CT CHEST ABDOMEN PELVIS W CONTRAST    Standing Status:   Future    Standing Expiration Date:   07/28/2021    Order Specific Question:   If indicated for the ordered procedure, I authorize the administration of contrast media per Radiology protocol    Answer:   Yes    Order Specific  Question:   Preferred imaging location?    Answer:   [facility]    Order Specific Question:   Release to patient    Answer:   Immediate    Order Specific Question:   Is Oral Contrast requested for this exam?    Answer:   Yes, Per Radiology protocol    Order Specific Question:   Reason for Exam (SYMPTOM  OR DIAGNOSIS REQUIRED)    Answer:   F/U ADENOPATHY   All questions were answered. The patient knows to call the clinic with any problems, questions or concerns. No barriers to learning was detected. The total time spent in the appointment was 25 minutes.     Martin Mood, MD 07/28/2020   I, Delphina Cahill, am acting as scribe for Martin Mood, MD.   I have reviewed the above documentation for accuracy and completeness, and I agree with the above.

## 2020-07-28 ENCOUNTER — Inpatient Hospital Stay (HOSPITAL_BASED_OUTPATIENT_CLINIC_OR_DEPARTMENT_OTHER): Payer: 59 | Admitting: Hematology

## 2020-07-28 ENCOUNTER — Inpatient Hospital Stay: Payer: 59 | Attending: Hematology

## 2020-07-28 ENCOUNTER — Other Ambulatory Visit: Payer: Self-pay

## 2020-07-28 ENCOUNTER — Encounter: Payer: Self-pay | Admitting: Hematology

## 2020-07-28 VITALS — BP 157/97 | HR 93 | Temp 97.9°F | Resp 15 | Ht 67.0 in | Wt 187.4 lb

## 2020-07-28 DIAGNOSIS — I10 Essential (primary) hypertension: Secondary | ICD-10-CM | POA: Diagnosis not present

## 2020-07-28 DIAGNOSIS — R599 Enlarged lymph nodes, unspecified: Secondary | ICD-10-CM

## 2020-07-28 DIAGNOSIS — E119 Type 2 diabetes mellitus without complications: Secondary | ICD-10-CM | POA: Diagnosis not present

## 2020-07-28 DIAGNOSIS — K219 Gastro-esophageal reflux disease without esophagitis: Secondary | ICD-10-CM | POA: Insufficient documentation

## 2020-07-28 DIAGNOSIS — Z7984 Long term (current) use of oral hypoglycemic drugs: Secondary | ICD-10-CM | POA: Diagnosis not present

## 2020-07-28 LAB — CMP (CANCER CENTER ONLY)
ALT: 39 U/L (ref 0–44)
AST: 26 U/L (ref 15–41)
Albumin: 4.3 g/dL (ref 3.5–5.0)
Alkaline Phosphatase: 69 U/L (ref 38–126)
Anion gap: 9 (ref 5–15)
BUN: 9 mg/dL (ref 6–20)
CO2: 25 mmol/L (ref 22–32)
Calcium: 10 mg/dL (ref 8.9–10.3)
Chloride: 102 mmol/L (ref 98–111)
Creatinine: 1.09 mg/dL (ref 0.61–1.24)
GFR, Estimated: >60 mL/min (ref >60)
Glucose, Bld: 186 mg/dL — ABNORMAL HIGH (ref 70–99)
Potassium: 4.6 mmol/L (ref 3.5–5.1)
Sodium: 136 mmol/L (ref 135–145)
Total Bilirubin: 0.5 mg/dL (ref 0.3–1.2)
Total Protein: 7.8 g/dL (ref 6.5–8.1)

## 2020-07-28 LAB — CBC WITH DIFFERENTIAL (CANCER CENTER ONLY)
Abs Immature Granulocytes: 0.03 10*3/uL (ref 0.00–0.07)
Basophils Absolute: 0.1 10*3/uL (ref 0.0–0.1)
Basophils Relative: 1 %
Eosinophils Absolute: 0.3 10*3/uL (ref 0.0–0.5)
Eosinophils Relative: 4 %
HCT: 47.3 % (ref 39.0–52.0)
Hemoglobin: 15.7 g/dL (ref 13.0–17.0)
Immature Granulocytes: 0 %
Lymphocytes Relative: 37 %
Lymphs Abs: 2.9 10*3/uL (ref 0.7–4.0)
MCH: 26.7 pg (ref 26.0–34.0)
MCHC: 33.2 g/dL (ref 30.0–36.0)
MCV: 80.6 fL (ref 80.0–100.0)
Monocytes Absolute: 0.4 10*3/uL (ref 0.1–1.0)
Monocytes Relative: 5 %
Neutro Abs: 4.1 10*3/uL (ref 1.7–7.7)
Neutrophils Relative %: 53 %
Platelet Count: 313 10*3/uL (ref 150–400)
RBC: 5.87 MIL/uL — ABNORMAL HIGH (ref 4.22–5.81)
RDW: 12.5 % (ref 11.5–15.5)
WBC Count: 7.9 10*3/uL (ref 4.0–10.5)
nRBC: 0 % (ref 0.0–0.2)

## 2020-07-28 LAB — LACTATE DEHYDROGENASE: LDH: 158 U/L (ref 98–192)

## 2020-07-30 ENCOUNTER — Telehealth: Payer: Self-pay | Admitting: Hematology

## 2020-07-30 NOTE — Telephone Encounter (Signed)
Scheduled appts per 12/6 los. Pt request to do appts a few weeks earlier. Scheduled earlier per pt's request. Pt confirmed appt dates and times.

## 2020-09-25 ENCOUNTER — Other Ambulatory Visit: Payer: Self-pay

## 2020-09-26 ENCOUNTER — Ambulatory Visit (INDEPENDENT_AMBULATORY_CARE_PROVIDER_SITE_OTHER): Payer: 59 | Admitting: Family Medicine

## 2020-09-26 ENCOUNTER — Encounter: Payer: Self-pay | Admitting: Family Medicine

## 2020-09-26 VITALS — BP 138/90 | HR 91 | Temp 98.4°F | Ht 68.0 in | Wt 186.6 lb

## 2020-09-26 DIAGNOSIS — E785 Hyperlipidemia, unspecified: Secondary | ICD-10-CM | POA: Diagnosis not present

## 2020-09-26 DIAGNOSIS — E559 Vitamin D deficiency, unspecified: Secondary | ICD-10-CM | POA: Diagnosis not present

## 2020-09-26 DIAGNOSIS — E1169 Type 2 diabetes mellitus with other specified complication: Secondary | ICD-10-CM

## 2020-09-26 DIAGNOSIS — E538 Deficiency of other specified B group vitamins: Secondary | ICD-10-CM | POA: Diagnosis not present

## 2020-09-26 DIAGNOSIS — I152 Hypertension secondary to endocrine disorders: Secondary | ICD-10-CM

## 2020-09-26 DIAGNOSIS — Z0000 Encounter for general adult medical examination without abnormal findings: Secondary | ICD-10-CM | POA: Diagnosis not present

## 2020-09-26 DIAGNOSIS — E119 Type 2 diabetes mellitus without complications: Secondary | ICD-10-CM | POA: Diagnosis not present

## 2020-09-26 DIAGNOSIS — E1159 Type 2 diabetes mellitus with other circulatory complications: Secondary | ICD-10-CM

## 2020-09-26 DIAGNOSIS — L309 Dermatitis, unspecified: Secondary | ICD-10-CM

## 2020-09-26 LAB — MICROALBUMIN / CREATININE URINE RATIO
Creatinine,U: 77.3 mg/dL
Microalb Creat Ratio: 1.4 mg/g (ref 0.0–30.0)
Microalb, Ur: 1.1 mg/dL (ref 0.0–1.9)

## 2020-09-26 LAB — LIPID PANEL
Cholesterol: 153 mg/dL (ref 0–200)
HDL: 40.2 mg/dL (ref >39.00)
LDL Cholesterol: 77 mg/dL (ref 0–99)
NonHDL: 112.34
Total CHOL/HDL Ratio: 4
Triglycerides: 176 mg/dL — ABNORMAL HIGH (ref 0.0–149.0)
VLDL: 35.2 mg/dL (ref 0.0–40.0)

## 2020-09-26 LAB — HEMOGLOBIN A1C: Hgb A1c MFr Bld: 10 % — ABNORMAL HIGH (ref 4.6–6.5)

## 2020-09-26 LAB — TSH: TSH: 2.7 u[IU]/mL (ref 0.35–4.50)

## 2020-09-26 LAB — VITAMIN D 25 HYDROXY (VIT D DEFICIENCY, FRACTURES): VITD: 12.12 ng/mL — ABNORMAL LOW (ref 30.00–100.00)

## 2020-09-26 LAB — VITAMIN B12: Vitamin B-12: 119 pg/mL — ABNORMAL LOW (ref 211–911)

## 2020-09-26 MED ORDER — BLOOD GLUCOSE MONITOR KIT: PACK | 0 refills | Status: DC

## 2020-09-26 NOTE — Progress Notes (Signed)
Martin Sanchez DOB: 1976/02/26 Encounter date: 09/26/2020  This is a 45 y.o. male who presents for complete physical   History of present illness/Additional concerns: Last visit was 03/28/2020 to establish care.  Thinks something going on in left ear - used q tip a couple of weeks ago and there was some blood on qtip. Feels full; not painful. No drainage.   Wakes easily from sleep. Hasn't tried anything for sleep. Having more dreams. We discussed trying melatonin.   More bloating, fullness. Sometimes cough - thinks acid production. Has been going on for 3-4 months. Has bowel movements about daily; some better than others. Has tried pepto; omeprazole otc. Didn't help with gas production. Thinks worse since quit walking.   Also getting rash on right side and just started on left side - has been there for about 10 days. Has tried lotion and cortisone but doesn't note improvement. Started before recent traveling.   Hasn't been walking or using gym as much since holidays. Feels that he has lost some of frame, muscle mass.   Type 2 diabetes: Janumet - machine not working so hasn't been checking.  HL: crestor 41m. Tolerates this well. Feels that sugars are going to be high due to eating.   HTN: hasn't been checking at home regularly. Didn't take in last few weeks. Always surprised when bp is higher in office.   Elevated liver enzymes: improved when alcohol stopped  Lymphadenopathy: following with oncology, Dr. FBurr Medico  Last visit was 07/28/2020.  CT in December showed mostly stable lymph nodes in the pelvis per oncology.  Plan is to repeat scan in 2022.  Does use triamcinolone for dry patches. Just on occasion that this occurs.   Occasional acid reflux/burping. Takes omeprazole on occasion.   Uses zyrtec just if needed for allergies.   Past Medical History:  Diagnosis Date  . BMI 28.0-28.9,adult 06/11/2016  . Essential hypertension 06/11/2016  . Hyperlipemia 03/08/2016  . Type 2 diabetes  mellitus without complication, without long-term current use of insulin ([place] 03/08/2016   Past Surgical History:  Procedure Laterality Date  . LYMPH NODE BIOPSY Right 05/13/2017   No Known Allergies Current Meds  Medication Sig  . blood glucose meter kit and supplies KIT Dispense based on patient and insurance preference. Use up to four times daily as directed. (FOR ICD-9 250.00, 250.01).  . Fluocinolone Acetonide Scalp 0.01 % OIL Apply 1 application topically daily as needed.  .Marland Kitchenlosartan (COZAAR) 100 MG tablet Take 1 tablet (100 mg total) by mouth daily.  . rosuvastatin (CRESTOR) 10 MG tablet TAKE 1 TABLET BY MOUTH  DAILY  . sitaGLIPtin-metformin (JANUMET) 50-500 MG tablet Take 1 tablet by mouth 2 (two) times daily with a meal.  . triamcinolone cream (KENALOG) 0.1 % Apply topically 2 (two) times daily. APPLY 1 APPLICATION TOPICALLY TWICE A DAY   Social History   Tobacco Use  . Smoking status: Never Smoker  . Smokeless tobacco: Never Used  Substance Use Topics  . Alcohol use: No   Family History  Problem Relation Age of Onset  . Diabetes Father   . Leukemia Father        CLL  . High blood pressure Mother   . High blood pressure Brother      Review of Systems  Constitutional: Negative for activity change, appetite change, chills, fatigue, fever and unexpected weight change.  HENT: Negative for congestion, ear pain (see hpi), hearing loss, sinus pressure, sinus pain, sore throat and trouble swallowing.  Eyes: Negative for pain and visual disturbance.  Respiratory: Negative for cough, chest tightness, shortness of breath and wheezing.   Cardiovascular: Negative for chest pain, palpitations and leg swelling.  Gastrointestinal: Negative for abdominal distention, abdominal pain, blood in stool, constipation, diarrhea, nausea and vomiting.       See hpi   Genitourinary: Negative for decreased urine volume, difficulty urinating, dysuria, penile pain and testicular pain.   Musculoskeletal: Negative for arthralgias, back pain and joint swelling.  Skin: Positive for rash (see hpi).  Neurological: Negative for dizziness, weakness, numbness and headaches.  Hematological: Negative for adenopathy. Does not bruise/bleed easily.  Psychiatric/Behavioral: Negative for agitation, sleep disturbance and suicidal ideas. The patient is not nervous/anxious.     CBC:  Lab Results  Component Value Date   WBC 7.9 07/28/2020   WBC 6.6 03/28/2020   HGB 15.7 07/28/2020   HGB 16.9 06/20/2017   HCT 47.3 07/28/2020   HCT 49.7 06/20/2017   MCH 26.7 07/28/2020   MCHC 33.2 07/28/2020   RDW 12.5 07/28/2020   RDW 12.9 06/20/2017   PLT 313 07/28/2020   PLT 261 06/20/2017   MPV 10.8 03/28/2020   CMP: Lab Results  Component Value Date   NA 136 07/28/2020   NA 140 06/20/2017   K 4.6 07/28/2020   K 4.7 06/20/2017   CL 102 07/28/2020   CO2 25 07/28/2020   CO2 27 06/20/2017   ANIONGAP 9 07/28/2020   GLUCOSE 186 (H) 07/28/2020   GLUCOSE 100 06/20/2017   BUN 9 07/28/2020   BUN 8.3 06/20/2017   CREATININE 1.09 07/28/2020   CREATININE 0.91 03/28/2020   CREATININE 0.9 06/20/2017   GFRAA >60 08/02/2019   CALCIUM 10.0 07/28/2020   CALCIUM 9.9 06/20/2017   PROT 7.8 07/28/2020   PROT 7.5 06/20/2017   BILITOT 0.5 07/28/2020   BILITOT 0.67 06/20/2017   ALKPHOS 69 07/28/2020   ALKPHOS 55 06/20/2017   ALT 39 07/28/2020   ALT 22 06/20/2017   AST 26 07/28/2020   AST 19 06/20/2017   LIPID: Lab Results  Component Value Date   CHOL 153 09/26/2020   TRIG 176.0 (H) 09/26/2020   HDL 40.20 09/26/2020   LDLCALC 77 09/26/2020   LDLCALC 117 (H) 03/28/2020    Objective:  BP 138/90 (BP Location: Left Arm, Patient Position: Sitting, Cuff Size: Normal)   Pulse 91   Temp 98.4 F (36.9 C) (Oral)   Ht 5' 8"  (1.727 m)   Wt 186 lb 9.6 oz (84.6 kg)   SpO2 97%   BMI 28.37 kg/m   Weight: 186 lb 9.6 oz (84.6 kg)   BP Readings from Last 3 Encounters:  09/26/20 138/90  07/28/20  (!) 157/97  03/28/20 (!) 168/100   Wt Readings from Last 3 Encounters:  09/26/20 186 lb 9.6 oz (84.6 kg)  07/28/20 187 lb 6.4 oz (85 kg)  03/28/20 187 lb 11.2 oz (85.1 kg)    Physical Exam Constitutional:      General: He is not in acute distress.    Appearance: He is well-developed and well-nourished.  HENT:     Head: Normocephalic and atraumatic.     Right Ear: Tympanic membrane, ear canal and external ear normal.     Left Ear: Tympanic membrane and external ear normal.     Ears:     Comments: There is dried blood noted in the left ear canal, small amount.  No erythema or edema noted in canal.  No tenderness on exam.  Nose: Nose normal.     Mouth/Throat:     Mouth: Oropharynx is clear and moist.     Pharynx: No oropharyngeal exudate.  Eyes:     Conjunctiva/sclera: Conjunctivae normal.     Pupils: Pupils are equal, round, and reactive to light.  Neck:     Thyroid: No thyromegaly.  Cardiovascular:     Rate and Rhythm: Normal rate and regular rhythm.     Pulses: Intact distal pulses.     Heart sounds: Normal heart sounds. No murmur heard. No friction rub. No gallop.   Pulmonary:     Effort: Pulmonary effort is normal. No respiratory distress.     Breath sounds: Normal breath sounds. No stridor. No wheezing or rales.  Abdominal:     General: Bowel sounds are normal.     Palpations: Abdomen is soft.     Tenderness: There is no abdominal tenderness.  Genitourinary:    Penis: Normal and circumcised.   Musculoskeletal:        General: Normal range of motion.     Cervical back: Neck supple.  Skin:    General: Skin is warm and dry.     Comments: Diffuse, nondiscrete erythematous maculopapular rash right side.  Early erythematous papules left side.  Neurological:     Mental Status: He is alert and oriented to person, place, and time.  Psychiatric:        Mood and Affect: Mood and affect normal.        Behavior: Behavior normal.        Thought Content: Thought content  normal.        Judgment: Judgment normal.     Assessment/Plan: Health Maintenance Due  Topic Date Due  . PNEUMOCOCCAL POLYSACCHARIDE VACCINE AGE 84-64 HIGH RISK  Never done  . COLONOSCOPY (Pts 45-69yr Insurance coverage will need to be confirmed)  Never done   Health Maintenance reviewed.  1. Preventative health care We discussed importance of regular exercise and healthy eating.  We will complete blood work today.  No family history of colon cancer.  Would consider GI follow-up if bloating is not improved (well determined pending blood work results)  2. Hypertension associated with diabetes ([facility] Blood pressure slightly elevated today in the office, same on recheck.  We discussed importance of regular walking, exercise and healthy weight loss.  I do think blood pressure will improve with this.  3. Type 2 diabetes mellitus without complication, without long-term current use of insulin ([facility] He has not been exercising or eating as well as normal.  We will check blood work today and determine follow-up pending results. - blood glucose meter kit and supplies KIT; Dispense based on patient and insurance preference. Use up to four times daily as directed. (FOR ICD-9 250.00, 250.01).  Dispense: 1 each; Refill: 0 - Hemoglobin A1c; Future - HM DIABETES FOOT EXAM - Microalbumin / creatinine urine ratio; Future - Microalbumin / creatinine urine ratio - Hemoglobin A1c  4. Hyperlipidemia associated with type 2 diabetes mellitus (HCC) Continue Crestor 10 mg daily.  Recheck blood work today. - Lipid panel; Future - TSH; Future - TSH - Lipid panel  5. Vitamin D deficiency - VITAMIN D 25 Hydroxy (Vit-D Deficiency, Fractures); Future - VITAMIN D 25 Hydroxy (Vit-D Deficiency, Fractures)  6. B12 deficiency - Vitamin B12; Future - Vitamin B12  7. Dermatitis Advised using triamcinolone cream on rash.  Avoid hot showering.  Use Dove soap for washing.  If not resolving in 2 weeks time, let  me  know.  Encouraged him to think through environment and possible changes (soaps, lotions) and he is unable to think of possible trigger for rash.  Return for nurse visit for prevnar when he chooses.  Micheline Rough, MD

## 2020-09-26 NOTE — Patient Instructions (Signed)
When you restart the losartan; just start at half tab (50mg ) and do this for 2 weeks. Our goal bp for you would be 110-120/70-80. If you are not there in a couple of weeks; then increase to 100mg .

## 2020-10-02 MED ORDER — VITAMIN D (ERGOCALCIFEROL) 1.25 MG (50000 UNIT) PO CAPS: 50000.0000 [IU] | ORAL_CAPSULE | ORAL | 0 refills | Status: DC

## 2020-10-02 NOTE — Addendum Note (Signed)
Addended by: Johnella Moloney on: 10/02/2020 02:36 PM   Modules accepted: Orders

## 2020-11-04 ENCOUNTER — Encounter: Payer: Self-pay | Admitting: Family Medicine

## 2020-12-26 ENCOUNTER — Encounter: Payer: Self-pay | Admitting: Family Medicine

## 2020-12-29 ENCOUNTER — Encounter: Payer: Self-pay | Admitting: Family Medicine

## 2020-12-29 ENCOUNTER — Telehealth (INDEPENDENT_AMBULATORY_CARE_PROVIDER_SITE_OTHER): Payer: 59 | Admitting: Family Medicine

## 2020-12-29 DIAGNOSIS — U071 COVID-19: Secondary | ICD-10-CM

## 2020-12-29 MED ORDER — ALBUTEROL SULFATE HFA 108 (90 BASE) MCG/ACT IN AERS: 2.0000 | INHALATION_SPRAY | Freq: Four times a day (QID) | RESPIRATORY_TRACT | 0 refills | Status: DC | PRN

## 2020-12-29 MED ORDER — BENZONATATE 100 MG PO CAPS: 100.0000 mg | ORAL_CAPSULE | Freq: Two times a day (BID) | ORAL | 0 refills | Status: DC | PRN

## 2020-12-29 NOTE — Progress Notes (Signed)
Virtual Visit via Video Note  I connected with Martin Sanchez on 12/29/20 at  2:30 PM EDT by a video enabled telemedicine application 2/2 [id number] pandemic and verified that I am speaking with the correct person using two identifiers.  Location patient: home Location provider:work or home office Persons participating in the virtual visit: patient, provider  I discussed the limitations of evaluation and management by telemedicine and the availability of in person appointments. The patient expressed understanding and agreed to proceed.   HPI: Pt tested positive for COVID on Friday with at home test.  Endorses symptoms started with HA and sinus pressure last Wednesday (6 days ago) while traveling for work to [place].  Pt took tylenol and advil.  Developed throat congestion and productive cough the next day.  Increased sweating,.  Notices more at night, no fever.  Also took Sinex. Having fatigue and mild SOB with going up stairs.  Food with a bitter taste since Saturday.  Pt states symptoms seem to be improving.  Still having cough with phlegm/chest congestion.  Patient notes in the past albuterol inhaler helped.  Pt's wife has started feeling sick.     Fully vaccinated and boosted.   ROS: See pertinent positives and negatives per HPI.  Past Medical History:  Diagnosis Date  . BMI 28.0-28.9,adult 06/11/2016  . Essential hypertension 06/11/2016  . Hyperlipemia 03/08/2016  . Type 2 diabetes mellitus without complication, without long-term current use of insulin (Martin Sanchez) 03/08/2016    Past Surgical History:  Procedure Laterality Date  . LYMPH NODE BIOPSY Right 05/13/2017    Family History  Problem Relation Age of Onset  . Diabetes Father   . Leukemia Father        CLL  . High blood pressure Mother   . High blood pressure Brother     Current Outpatient Medications:  .  blood glucose meter kit and supplies KIT, Dispense based on patient and insurance preference. Use up to four times daily  as directed. (FOR ICD-9 250.00, 250.01)., Disp: 1 each, Rfl: 0 .  Fluocinolone Acetonide Scalp 0.01 % OIL, Apply 1 application topically daily as needed., Disp: 118.28 mL, Rfl: 2 .  losartan (COZAAR) 100 MG tablet, Take 1 tablet (100 mg total) by mouth daily., Disp: 90 tablet, Rfl: 3 .  rosuvastatin (CRESTOR) 10 MG tablet, TAKE 1 TABLET BY MOUTH  DAILY, Disp: 90 tablet, Rfl: 3 .  sitaGLIPtin-metformin (JANUMET) 50-500 MG tablet, Take 1 tablet by mouth 2 (two) times daily with a meal., Disp: 180 tablet, Rfl: 3 .  triamcinolone cream (KENALOG) 0.1 %, Apply topically 2 (two) times daily. APPLY 1 APPLICATION TOPICALLY TWICE A DAY, Disp: 30 g, Rfl: 0 .  Vitamin D, Ergocalciferol, (DRISDOL) 1.25 MG (50000 UNIT) CAPS capsule, Take 1 capsule (50,000 Units total) by mouth every 7 (seven) days., Disp: 12 capsule, Rfl: 0  EXAM:  VITALS per patient if applicable: RR between 23-55 bpm  GENERAL: alert, oriented, appears well and in no acute distress  HEENT: atraumatic, conjunctiva clear, no obvious abnormalities on inspection of external nose and ears  NECK: normal movements of the head and neck  LUNGS: on inspection no signs of respiratory distress, breathing rate appears normal, no obvious gross SOB, gasping or wheezing  CV: no obvious cyanosis  MS: moves all visible extremities without noticeable abnormality  PSYCH/NEURO: pleasant and cooperative, no obvious depression or anxiety, speech and thought processing grossly intact  ASSESSMENT AND PLAN:  Discussed the following assessment and plan:  COVID-19 virus  infection  -positive home COVID test. -Symptomatic treatment of symptoms.  Okay to take OTC vitamins and supplements such as including zinc, vitamin C, magnesium -Given strict precautions -Continue to monitor -Discussed antiviral medication, however outside of treatment window and symptoms improving. - Plan: albuterol (VENTOLIN HFA) 108 (90 Base) MCG/ACT inhaler, benzonatate (TESSALON)  100 MG capsule  Follow-up as needed   I discussed the assessment and treatment plan with the patient. The patient was provided an opportunity to ask questions and all were answered. The patient agreed with the plan and demonstrated an understanding of the instructions.   The patient was advised to call back or seek an in-person evaluation if the symptoms worsen or if the condition fails to improve as anticipated.  [name], MD

## 2021-01-20 ENCOUNTER — Other Ambulatory Visit: Payer: Self-pay | Admitting: Family Medicine

## 2021-01-20 DIAGNOSIS — U071 COVID-19: Secondary | ICD-10-CM

## 2021-06-03 ENCOUNTER — Telehealth: Payer: Self-pay | Admitting: Hematology

## 2021-06-03 NOTE — Telephone Encounter (Signed)
Rescheduled per pt request, pt has confirmed new appt

## 2021-06-15 ENCOUNTER — Other Ambulatory Visit: Payer: Self-pay | Admitting: Family Medicine

## 2021-06-16 ENCOUNTER — Other Ambulatory Visit: Payer: Self-pay | Admitting: Family Medicine

## 2021-06-16 ENCOUNTER — Encounter: Payer: Self-pay | Admitting: Family Medicine

## 2021-06-16 MED ORDER — JANUMET 50-500 MG PO TABS: 1.0000 | ORAL_TABLET | Freq: Two times a day (BID) | ORAL | 0 refills | Status: DC

## 2021-06-16 MED ORDER — LOSARTAN POTASSIUM 100 MG PO TABS: 100.0000 mg | ORAL_TABLET | Freq: Every day | ORAL | 0 refills | Status: DC

## 2021-06-17 ENCOUNTER — Telehealth: Payer: Self-pay | Admitting: *Deleted

## 2021-06-17 ENCOUNTER — Other Ambulatory Visit: Payer: Self-pay | Admitting: Family Medicine

## 2021-06-17 NOTE — Telephone Encounter (Signed)
Prior auth already sent-see prior message.

## 2021-06-17 NOTE — Telephone Encounter (Signed)
Fax received from [facility] stating the forms that were faxed on 10/25  to [phone] for a request for prior auth on Janumet 50-500mg  was denied.  I called the patient, informed him of this and advised he contact his insurance for coverage options or pay for the Rx and he agreed.  Forms and fax were sent to be scanned.

## 2021-06-17 NOTE — Telephone Encounter (Addendum)
Pt is calling and Janumet 50-500 mg needs PA please call [phone]. Patient would like a callback

## 2021-06-22 ENCOUNTER — Telehealth: Payer: Self-pay | Admitting: Family Medicine

## 2021-06-22 ENCOUNTER — Telehealth: Payer: Self-pay | Admitting: *Deleted

## 2021-06-22 NOTE — Telephone Encounter (Signed)
Error

## 2021-06-22 NOTE — Telephone Encounter (Signed)
Martin Sanchez transferred a call to me from Martin Sanchez with [employer] (ph#[phone]) as she stated Martin Sanchez will not end the phone call with her regarding a prior auth.  I spoke with Martin Sanchez and she stated the PA was denied as the note stated the patient has not tried any other medications such as Glipizide or Martin Sanchez and she has the patient on her line and he told her Dr Martin Sanchez Batten prescribed Martin Sanchez for him. After researching the chart, I found an office visit from 03/08/2016 in which Dr Martin Sanchez Batten noted the patient was advised to take Martin Sanchez and Martin Sanchez, this was not prescribed by her and she changed him to Martin Sanchez at that visit.  Martin Sanchez was given this information, stated a new prior Martin Sanchez will be done with this information and she will inform the patient.  I also advised Martin Sanchez to leave a message in the future regarding prior authorizations if needed and she agreed.

## 2021-06-22 NOTE — Telephone Encounter (Signed)
See prior phone message from [facility].

## 2021-06-26 ENCOUNTER — Other Ambulatory Visit: Payer: Self-pay

## 2021-06-26 ENCOUNTER — Ambulatory Visit (INDEPENDENT_AMBULATORY_CARE_PROVIDER_SITE_OTHER): Payer: 59 | Admitting: *Deleted

## 2021-06-26 ENCOUNTER — Ambulatory Visit: Payer: 59

## 2021-06-26 DIAGNOSIS — Z23 Encounter for immunization: Secondary | ICD-10-CM | POA: Diagnosis not present

## 2021-07-13 ENCOUNTER — Other Ambulatory Visit: Payer: 59

## 2021-07-15 ENCOUNTER — Ambulatory Visit: Payer: 59 | Admitting: Hematology

## 2021-07-24 ENCOUNTER — Other Ambulatory Visit: Payer: Self-pay

## 2021-07-24 ENCOUNTER — Telehealth: Payer: Self-pay

## 2021-07-24 ENCOUNTER — Inpatient Hospital Stay: Payer: 59 | Attending: Hematology

## 2021-07-24 DIAGNOSIS — K219 Gastro-esophageal reflux disease without esophagitis: Secondary | ICD-10-CM | POA: Insufficient documentation

## 2021-07-24 DIAGNOSIS — R590 Localized enlarged lymph nodes: Secondary | ICD-10-CM

## 2021-07-24 DIAGNOSIS — E119 Type 2 diabetes mellitus without complications: Secondary | ICD-10-CM | POA: Insufficient documentation

## 2021-07-24 DIAGNOSIS — Z79899 Other long term (current) drug therapy: Secondary | ICD-10-CM | POA: Insufficient documentation

## 2021-07-24 DIAGNOSIS — Z7984 Long term (current) use of oral hypoglycemic drugs: Secondary | ICD-10-CM | POA: Insufficient documentation

## 2021-07-24 DIAGNOSIS — I10 Essential (primary) hypertension: Secondary | ICD-10-CM | POA: Insufficient documentation

## 2021-07-24 DIAGNOSIS — R599 Enlarged lymph nodes, unspecified: Secondary | ICD-10-CM | POA: Insufficient documentation

## 2021-07-24 LAB — CBC WITH DIFFERENTIAL/PLATELET
Abs Immature Granulocytes: 0.02 10*3/uL (ref 0.00–0.07)
Basophils Absolute: 0.1 10*3/uL (ref 0.0–0.1)
Basophils Relative: 1 %
Eosinophils Absolute: 0.3 10*3/uL (ref 0.0–0.5)
Eosinophils Relative: 4 %
HCT: 45.8 % (ref 39.0–52.0)
Hemoglobin: 15.5 g/dL (ref 13.0–17.0)
Immature Granulocytes: 0 %
Lymphocytes Relative: 34 %
Lymphs Abs: 3.1 10*3/uL (ref 0.7–4.0)
MCH: 26.7 pg (ref 26.0–34.0)
MCHC: 33.8 g/dL (ref 30.0–36.0)
MCV: 79 fL — ABNORMAL LOW (ref 80.0–100.0)
Monocytes Absolute: 0.5 10*3/uL (ref 0.1–1.0)
Monocytes Relative: 5 %
Neutro Abs: 5.1 10*3/uL (ref 1.7–7.7)
Neutrophils Relative %: 56 %
Platelets: 272 10*3/uL (ref 150–400)
RBC: 5.8 MIL/uL (ref 4.22–5.81)
RDW: 13.1 % (ref 11.5–15.5)
WBC: 9.1 10*3/uL (ref 4.0–10.5)
nRBC: 0 % (ref 0.0–0.2)

## 2021-07-24 LAB — COMPREHENSIVE METABOLIC PANEL
ALT: 28 U/L (ref 0–44)
AST: 19 U/L (ref 15–41)
Albumin: 4.4 g/dL (ref 3.5–5.0)
Alkaline Phosphatase: 78 U/L (ref 38–126)
Anion gap: 10 (ref 5–15)
BUN: 12 mg/dL (ref 6–20)
CO2: 26 mmol/L (ref 22–32)
Calcium: 9.6 mg/dL (ref 8.9–10.3)
Chloride: 99 mmol/L (ref 98–111)
Creatinine, Ser: 1.03 mg/dL (ref 0.61–1.24)
GFR, Estimated: >60 mL/min (ref >60)
Glucose, Bld: 384 mg/dL — ABNORMAL HIGH (ref 70–99)
Potassium: 4.5 mmol/L (ref 3.5–5.1)
Sodium: 135 mmol/L (ref 135–145)
Total Bilirubin: 0.6 mg/dL (ref 0.3–1.2)
Total Protein: 7.5 g/dL (ref 6.5–8.1)

## 2021-07-24 LAB — LACTATE DEHYDROGENASE: LDH: 137 U/L (ref 98–192)

## 2021-07-24 NOTE — Telephone Encounter (Signed)
This nurse spoke with patient and requested patient to come in today for labs to be drawn for CT scan scheduled on 07/27/2021.  Patient states he can come in at 130 pm.  No further questions or concerns at this time.

## 2021-07-27 ENCOUNTER — Ambulatory Visit (HOSPITAL_COMMUNITY)
Admission: RE | Admit: 2021-07-27 | Discharge: 2021-07-27 | Disposition: A | Discharge status: Home / Self Care | Payer: 59 | Source: Ambulatory Visit | Attending: Hematology | Admitting: Hematology

## 2021-07-27 ENCOUNTER — Other Ambulatory Visit: Payer: Self-pay

## 2021-07-27 DIAGNOSIS — R599 Enlarged lymph nodes, unspecified: Secondary | ICD-10-CM | POA: Diagnosis not present

## 2021-07-27 MED ORDER — SODIUM CHLORIDE (PF) 0.9 % IJ SOLN
INTRAMUSCULAR | Status: AC
  Filled 2021-07-27: qty 50

## 2021-07-27 MED ORDER — IOHEXOL 350 MG/ML SOLN
80.0000 mL | Freq: Once | INTRAVENOUS | Status: AC | PRN
  Administered 2021-07-27: 80 mL via INTRAVENOUS

## 2021-07-31 ENCOUNTER — Ambulatory Visit: Payer: 59 | Admitting: Hematology

## 2021-07-31 ENCOUNTER — Other Ambulatory Visit: Payer: 59

## 2021-08-10 ENCOUNTER — Other Ambulatory Visit: Payer: 59

## 2021-08-10 ENCOUNTER — Other Ambulatory Visit: Payer: Self-pay

## 2021-08-10 ENCOUNTER — Inpatient Hospital Stay (HOSPITAL_BASED_OUTPATIENT_CLINIC_OR_DEPARTMENT_OTHER): Payer: 59 | Admitting: Hematology

## 2021-08-10 ENCOUNTER — Encounter: Payer: Self-pay | Admitting: Hematology

## 2021-08-10 VITALS — BP 169/93 | HR 88 | Resp 20 | Ht 68.0 in | Wt 176.2 lb

## 2021-08-10 DIAGNOSIS — R599 Enlarged lymph nodes, unspecified: Secondary | ICD-10-CM | POA: Diagnosis not present

## 2021-08-10 NOTE — Progress Notes (Addendum)
[facility] [place]   [phone] [phone]   Clinic Follow up Note   Patient Care Team: Caren Macadam, MD as PCP - General (Family Medicine) Truitt Merle, MD as Consulting Physician (Hematology)  Date of Service:  08/10/2021  CHIEF COMPLAINT: f/u of lymphadenopathy  CURRENT THERAPY:  Observation  ASSESSMENT & PLAN:  Martin Sanchez is a 45 y.o. male with   1. Right inguinal and pelvic lymphadenopathy s/p biopsy 05/24/2017 with florid lymphoid hyperplasia -He initially presented with a palpable right inguinal lymphadenopathy, surgical biopsy of the lymph nodes showed florid lymphoid hyperplasia, negative for lymphoma in 05/2017.  -His 07/17/18 CT scan showed he developed multiple mildly enlarged pelvic lymph nodes, no palpable peripheral adenopathy except a 1cm right inguinal lymph node. We previously discussed her that this is likely benign process, although low grade lymphomas cannot be ruled out. I do not recommend biopsy at this point. -most recent CT CAP 07/27/21 was overall stable with no new adenopathy. I reviewed his images and discussed with him today. Given that the adenopathy has been overall stable over 4 years, I feel we can stop scans and observation -We reviewed symptoms to watch for, such as LN in groin, neck or axilla, significant unexpected weight loss or fatigue, and persistent or recurrent fever which are common symptoms of lymphoma.  -f/u open, he knows how to reach [place] with any concerns    2. HTN, DM, Acid Reflux -f/u with PCP q80month and continue recommended medications. -He plans to work on weight loss to help. He notes he joined the [employer]    Plan -lab and scan reviewed, no concerns -He is clinically doing well  -f/u open   No problem-specific Assessment & Plan notes found for this encounter.   INTERVAL HISTORY:  SMaycol Hoyingis here for a follow up of lymphadenopathy. He was last seen by me a year ago. He presents to the  clinic alone. He reports he is doing well.   All other systems were reviewed with the patient and are negative.  MEDICAL HISTORY:  Past Medical History:  Diagnosis Date   BMI 28.0-28.9,adult 06/11/2016   Essential hypertension 06/11/2016   Hyperlipemia 03/08/2016   Type 2 diabetes mellitus without complication, without long-term current use of insulin ([facility] 03/08/2016    SURGICAL HISTORY: Past Surgical History:  Procedure Laterality Date   LYMPH NODE BIOPSY Right 05/13/2017    I have reviewed the social history and family history with the patient and they are unchanged from previous note.  ALLERGIES:  has No Known Allergies.  MEDICATIONS:  Current Outpatient Medications  Medication Sig Dispense Refill   albuterol (VENTOLIN HFA) 108 (90 Base) MCG/ACT inhaler TAKE 2 PUFFS BY MOUTH EVERY 6 HOURS AS NEEDED FOR WHEEZE OR SHORTNESS OF BREATH 8.5 each 2   benzonatate (TESSALON) 100 MG capsule Take 1 capsule (100 mg total) by mouth 2 (two) times daily as needed for cough. 20 capsule 0   blood glucose meter kit and supplies KIT Dispense based on patient and insurance preference. Use up to four times daily as directed. (FOR ICD-9 250.00, 250.01). 1 each 0   Fluocinolone Acetonide Scalp 0.01 % OIL Apply 1 application topically daily as needed. 118.28 mL 2   losartan (COZAAR) 100 MG tablet TAKE 1 TABLET BY MOUTH  DAILY 90 tablet 1   rosuvastatin (CRESTOR) 10 MG tablet TAKE 1 TABLET BY MOUTH  DAILY 90 tablet 1   sitaGLIPtin-metformin (JANUMET) 50-500 MG tablet Take 1 tablet by  mouth 2 (two) times daily with a meal. 180 tablet 0   triamcinolone cream (KENALOG) 0.1 % Apply topically 2 (two) times daily. APPLY 1 APPLICATION TOPICALLY TWICE A DAY 30 g 0   Vitamin D, Ergocalciferol, (DRISDOL) 1.25 MG (50000 UNIT) CAPS capsule Take 1 capsule (50,000 Units total) by mouth every 7 (seven) days. 12 capsule 0   No current facility-administered medications for this visit.    PHYSICAL EXAMINATION: ECOG  PERFORMANCE STATUS: 0 - Asymptomatic  Vitals:   08/10/21 1102  BP: (!) 169/93  Pulse: 88  Resp: 20  SpO2: 100%   Wt Readings from Last 3 Encounters:  08/10/21 176 lb 3.2 oz (79.9 kg)  09/26/20 186 lb 9.6 oz (84.6 kg)  07/28/20 187 lb 6.4 oz (85 kg)     GENERAL:alert, no distress and comfortable SKIN: skin color, texture, turgor are normal, no rashes or significant lesions EYES: normal, Conjunctiva are pink and non-injected, sclera clear  NECK: supple, thyroid normal size, non-tender, without nodularity LYMPH:  no palpable lymphadenopathy in the cervical, axillary  LUNGS: clear to auscultation and percussion with normal breathing effort HEART: regular rate & rhythm and no murmurs and no lower extremity edema ABDOMEN:abdomen soft, non-tender and normal bowel sounds Musculoskeletal:no cyanosis of digits and no clubbing  NEURO: alert & oriented x 3 with fluent speech, no focal motor/sensory deficits  LABORATORY DATA:  I have reviewed the data as listed CBC Latest Ref Rng & Units 07/24/2021 07/28/2020 03/28/2020  WBC 4.0 - 10.5 K/uL 9.1 7.9 6.6  Hemoglobin 13.0 - 17.0 g/dL 15.5 15.7 15.8  Hematocrit 39.0 - 52.0 % 45.8 47.3 47.5  Platelets 150 - 400 K/uL 272 313 324     CMP Latest Ref Rng & Units 07/24/2021 07/28/2020 03/28/2020  Glucose 70 - 99 mg/dL 384(H) 186(H) 122(H)  BUN 6 - 20 mg/dL _0 Creatinine 0.61 - 1.24 mg/dL 1.03 1.09 0.91  Sodium 135 - 145 mmol/L 135 136 139  Potassium 3.5 - 5.1 mmol/L 4.5 4.6 5.0  Chloride 98 - 111 mmol/L 99 102 102  CO2 22 - 32 mmol/L _1 Calcium 8.9 - 10.3 mg/dL 9.6 10.0 9.5  Total Protein 6.5 - 8.1 g/dL 7.5 7.8 7.0  Total Bilirubin 0.3 - 1.2 mg/dL 0.6 0.5 0.5  Alkaline Phos 38 - 126 U/L 78 69 -  AST 15 - 41 U/L _2 ALT 0 - 44 U/L 28 39 26      RADIOGRAPHIC STUDIES: I have personally reviewed the radiological images as listed and agreed with the findings in the report. No results found.    No orders of the defined types  were placed in this encounter.  All questions were answered. The patient knows to call the clinic with any problems, questions or concerns. No barriers to learning was detected. The total time spent in the appointment was 25 minutes.     Truitt Merle, MD 08/10/2021   I, Wilburn Mylar, am acting as scribe for Truitt Merle, MD.   I have reviewed the above documentation for accuracy and completeness, and I agree with the above.

## 2021-09-28 ENCOUNTER — Ambulatory Visit (INDEPENDENT_AMBULATORY_CARE_PROVIDER_SITE_OTHER): Payer: 59 | Admitting: Family Medicine

## 2021-09-28 ENCOUNTER — Encounter: Payer: Self-pay | Admitting: Family Medicine

## 2021-09-28 ENCOUNTER — Other Ambulatory Visit: Payer: Self-pay

## 2021-09-28 VITALS — BP 132/88 | HR 94 | Temp 98.2°F | Ht 68.0 in | Wt 174.1 lb

## 2021-09-28 DIAGNOSIS — E559 Vitamin D deficiency, unspecified: Secondary | ICD-10-CM | POA: Diagnosis not present

## 2021-09-28 DIAGNOSIS — E119 Type 2 diabetes mellitus without complications: Secondary | ICD-10-CM

## 2021-09-28 DIAGNOSIS — E1169 Type 2 diabetes mellitus with other specified complication: Secondary | ICD-10-CM

## 2021-09-28 DIAGNOSIS — E785 Hyperlipidemia, unspecified: Secondary | ICD-10-CM | POA: Diagnosis not present

## 2021-09-28 DIAGNOSIS — E538 Deficiency of other specified B group vitamins: Secondary | ICD-10-CM

## 2021-09-28 DIAGNOSIS — Z0000 Encounter for general adult medical examination without abnormal findings: Secondary | ICD-10-CM | POA: Diagnosis not present

## 2021-09-28 DIAGNOSIS — E1165 Type 2 diabetes mellitus with hyperglycemia: Secondary | ICD-10-CM

## 2021-09-28 LAB — LIPID PANEL
Cholesterol: 191 mg/dL (ref 0–200)
HDL: 36.5 mg/dL — ABNORMAL LOW (ref >39.00)
NonHDL: 154.38
Total CHOL/HDL Ratio: 5
Triglycerides: 354 mg/dL — ABNORMAL HIGH (ref 0.0–149.0)
VLDL: 70.8 mg/dL — ABNORMAL HIGH (ref 0.0–40.0)

## 2021-09-28 LAB — CBC WITH DIFFERENTIAL/PLATELET
Basophils Absolute: 0.1 10*3/uL (ref 0.0–0.1)
Basophils Relative: 0.8 % (ref 0.0–3.0)
Eosinophils Absolute: 0.3 10*3/uL (ref 0.0–0.7)
Eosinophils Relative: 3.6 % (ref 0.0–5.0)
HCT: 46.4 % (ref 39.0–52.0)
Hemoglobin: 15.5 g/dL (ref 13.0–17.0)
Lymphocytes Relative: 33.2 % (ref 12.0–46.0)
Lymphs Abs: 2.6 10*3/uL (ref 0.7–4.0)
MCHC: 33.4 g/dL (ref 30.0–36.0)
MCV: 79.5 fl (ref 78.0–100.0)
Monocytes Absolute: 0.4 10*3/uL (ref 0.1–1.0)
Monocytes Relative: 5.8 % (ref 3.0–12.0)
Neutro Abs: 4.4 10*3/uL (ref 1.4–7.7)
Neutrophils Relative %: 56.6 % (ref 43.0–77.0)
Platelets: 315 10*3/uL (ref 150.0–400.0)
RBC: 5.84 Mil/uL — ABNORMAL HIGH (ref 4.22–5.81)
RDW: 13.7 % (ref 11.5–15.5)
WBC: 7.7 10*3/uL (ref 4.0–10.5)

## 2021-09-28 LAB — MICROALBUMIN / CREATININE URINE RATIO
Creatinine,U: 125.4 mg/dL
Microalb Creat Ratio: 1.7 mg/g (ref 0.0–30.0)
Microalb, Ur: 2.2 mg/dL — ABNORMAL HIGH (ref 0.0–1.9)

## 2021-09-28 LAB — COMPREHENSIVE METABOLIC PANEL
ALT: 27 U/L (ref 0–53)
AST: 19 U/L (ref 0–37)
Albumin: 4.5 g/dL (ref 3.5–5.2)
Alkaline Phosphatase: 56 U/L (ref 39–117)
BUN: 13 mg/dL (ref 6–23)
CO2: 31 mEq/L (ref 19–32)
Calcium: 9.4 mg/dL (ref 8.4–10.5)
Chloride: 96 mEq/L (ref 96–112)
Creatinine, Ser: 0.77 mg/dL (ref 0.40–1.50)
GFR: 107.21 mL/min (ref >60.00)
Glucose, Bld: 256 mg/dL — ABNORMAL HIGH (ref 70–99)
Potassium: 4.1 mEq/L (ref 3.5–5.1)
Sodium: 135 mEq/L (ref 135–145)
Total Bilirubin: 0.7 mg/dL (ref 0.2–1.2)
Total Protein: 6.8 g/dL (ref 6.0–8.3)

## 2021-09-28 LAB — POCT GLYCOSYLATED HEMOGLOBIN (HGB A1C): Hemoglobin A1C: 11.4 % — AB (ref 4.0–5.6)

## 2021-09-28 LAB — LDL CHOLESTEROL, DIRECT: Direct LDL: 107 mg/dL

## 2021-09-28 LAB — VITAMIN B12: Vitamin B-12: 640 pg/mL (ref 211–911)

## 2021-09-28 LAB — VITAMIN D 25 HYDROXY (VIT D DEFICIENCY, FRACTURES): VITD: 17.45 ng/mL — ABNORMAL LOW (ref 30.00–100.00)

## 2021-09-28 MED ORDER — BLOOD GLUCOSE MONITOR KIT: PACK | 0 refills | Status: DC

## 2021-09-28 MED ORDER — JANUMET XR 100-1000 MG PO TB24: 1.0000 | ORAL_TABLET | Freq: Every day | ORAL | 3 refills | Status: DC

## 2021-09-28 MED ORDER — JANUMET XR 100-1000 MG PO TB24: 1.0000 | ORAL_TABLET | Freq: Every day | ORAL | 1 refills | Status: DC

## 2021-09-28 MED ORDER — ROSUVASTATIN CALCIUM 10 MG PO TABS: 10.0000 mg | ORAL_TABLET | Freq: Every day | ORAL | 1 refills | Status: DC

## 2021-09-28 MED ORDER — ROSUVASTATIN CALCIUM 10 MG PO TABS: 10.0000 mg | ORAL_TABLET | Freq: Every day | ORAL | 3 refills | Status: DC

## 2021-09-28 NOTE — Addendum Note (Signed)
Addended by: Rosalyn Gess D on: 09/28/2021 09:25 AM   Modules accepted: Orders

## 2021-09-28 NOTE — Progress Notes (Signed)
Martin Sanchez DOB: [birth year] Encounter date: 09/28/2021  This is a 46 y.o. male who presents for complete physical   History of present illness/Additional concerns: Last visit with me was for complete physical 1 year ago. Feeling ok overall. Waking up to use restroom in morning; hard to fall back asleep sometimes. One month ago was sick when traveling; was feeling poorly for 4 days. Started going to gym. Just much more tired with exercise than he had been in past. Might be related to being sick recently.   Type 2 diabetes: Janumet 50-501 tablet twice daily. Hasn't been checking sugars; meter broke. Last A1C uncontrolled. Patient advised to schedule follow up to discuss but did not schedule. Does state that he missed some doses, but also went to [place] in summer and got some doses from there. So not as "noncompliant" as insurance states. Hasn't been eating as well as he could. Eats a lot of rice. Working on avoiding this. Eating toast in the morning.   Not feeling as hungry. Sometimes with acid reflux. More in last couple of weeks.   Hyperlipidemia: Crestor 10 mg hypertension: Losartan 100 mg Has been following with oncology for lymphadenopathy.  They have been following him with imaging status postbiopsy.  Imaging has been stable and at visit on 08/10/2021, no follow-up was suggested unless new symptoms arise.   Past Medical History:  Diagnosis Date   BMI 28.0-28.9,adult 06/11/2016   Essential hypertension 06/11/2016   Hyperlipemia 03/08/2016   Type 2 diabetes mellitus without complication, without long-term current use of insulin ([place]) 03/08/2016   Past Surgical History:  Procedure Laterality Date   LYMPH NODE BIOPSY Right 05/13/2017   No Known Allergies Current Meds  Medication Sig   albuterol (VENTOLIN HFA) 108 (90 Base) MCG/ACT inhaler TAKE 2 PUFFS BY MOUTH EVERY 6 HOURS AS NEEDED FOR WHEEZE OR SHORTNESS OF BREATH   Fluocinolone Acetonide Scalp 0.01 % OIL Apply 1 application topically  daily as needed.   losartan (COZAAR) 100 MG tablet TAKE 1 TABLET BY MOUTH  DAILY   SitaGLIPtin-MetFORMIN HCl (JANUMET XR) [phone] MG TB24 Take 1 tablet by mouth daily.   triamcinolone cream (KENALOG) 0.1 % Apply topically 2 (two) times daily. APPLY 1 APPLICATION TOPICALLY TWICE A DAY   [DISCONTINUED] blood glucose meter kit and supplies KIT Dispense based on patient and insurance preference. Use up to four times daily as directed. (FOR ICD-9 250.00, 250.01).   [DISCONTINUED] rosuvastatin (CRESTOR) 10 MG tablet TAKE 1 TABLET BY MOUTH  DAILY   [DISCONTINUED] sitaGLIPtin-metformin (JANUMET) 50-500 MG tablet Take 1 tablet by mouth 2 (two) times daily with a meal.   Social History   Tobacco Use   Smoking status: Never   Smokeless tobacco: Never  Substance Use Topics   Alcohol use: No   Family History  Problem Relation Age of Onset   Diabetes Father    Leukemia Father        CLL   High blood pressure Mother    High blood pressure Brother      Review of Systems  Constitutional:  Negative for activity change, appetite change, chills, fatigue, fever and unexpected weight change.  HENT:  Negative for congestion, ear pain, hearing loss, sinus pressure, sinus pain, sore throat and trouble swallowing.   Eyes:  Negative for pain and visual disturbance.  Respiratory:  Negative for cough, chest tightness, shortness of breath and wheezing.   Cardiovascular:  Negative for chest pain, palpitations and leg swelling.  Gastrointestinal:  Negative for abdominal  distention, abdominal pain, blood in stool, constipation, diarrhea, nausea and vomiting.  Genitourinary:  Negative for decreased urine volume, difficulty urinating, dysuria, penile pain and testicular pain.  Musculoskeletal:  Negative for arthralgias, back pain and joint swelling.  Skin:  Negative for rash.  Neurological:  Negative for dizziness, weakness, numbness and headaches.  Hematological:  Negative for adenopathy. Does not bruise/bleed  easily.  Psychiatric/Behavioral:  Negative for agitation, sleep disturbance and suicidal ideas. The patient is not nervous/anxious.    CBC:  Lab Results  Component Value Date   WBC 9.1 07/24/2021   HGB 15.5 07/24/2021   HGB 15.7 07/28/2020   HGB 16.9 06/20/2017   HCT 45.8 07/24/2021   HCT 49.7 06/20/2017   MCH 26.7 07/24/2021   MCHC 33.8 07/24/2021   RDW 13.1 07/24/2021   RDW 12.9 06/20/2017   PLT 272 07/24/2021   PLT 313 07/28/2020   PLT 261 06/20/2017   MPV 10.8 03/28/2020   CMP: Lab Results  Component Value Date   NA 135 07/24/2021   NA 140 06/20/2017   K 4.5 07/24/2021   K 4.7 06/20/2017   CL 99 07/24/2021   CO2 26 07/24/2021   CO2 27 06/20/2017   ANIONGAP 10 07/24/2021   GLUCOSE 384 (H) 07/24/2021   GLUCOSE 100 06/20/2017   BUN 12 07/24/2021   BUN 8.3 06/20/2017   CREATININE 1.03 07/24/2021   CREATININE 1.09 07/28/2020   CREATININE 0.91 03/28/2020   CREATININE 0.9 06/20/2017   GFRAA >60 08/02/2019   CALCIUM 9.6 07/24/2021   CALCIUM 9.9 06/20/2017   PROT 7.5 07/24/2021   PROT 7.5 06/20/2017   BILITOT 0.6 07/24/2021   BILITOT 0.5 07/28/2020   BILITOT 0.67 06/20/2017   ALKPHOS 78 07/24/2021   ALKPHOS 55 06/20/2017   ALT 28 07/24/2021   ALT 39 07/28/2020   ALT 22 06/20/2017   AST 19 07/24/2021   AST 26 07/28/2020   AST 19 06/20/2017   LIPID: Lab Results  Component Value Date   CHOL 153 09/26/2020   TRIG 176.0 (H) 09/26/2020   HDL 40.20 09/26/2020   LDLCALC 77 09/26/2020   LDLCALC 117 (H) 03/28/2020    Objective:  BP 132/88 (BP Location: Left Arm, Patient Position: Sitting, Cuff Size: Normal)    Pulse 94    Temp 98.2 F (36.8 C) (Oral)    Ht $R'5\' 8"'sZ$  (1.727 m)    Wt 174 lb 1.6 oz (79 kg)    SpO2 98%    BMI 26.47 kg/m   Weight: 174 lb 1.6 oz (79 kg)   BP Readings from Last 3 Encounters:  09/28/21 132/88  08/10/21 (!) 169/93  09/26/20 138/90   Wt Readings from Last 3 Encounters:  09/28/21 174 lb 1.6 oz (79 kg)  08/10/21 176 lb 3.2 oz (79.9  kg)  09/26/20 186 lb 9.6 oz (84.6 kg)    Physical Exam Constitutional:      General: He is not in acute distress.    Appearance: He is well-developed.  HENT:     Head: Normocephalic and atraumatic.     Right Ear: External ear normal.     Left Ear: External ear normal.     Nose: Nose normal.     Mouth/Throat:     Pharynx: No oropharyngeal exudate.  Eyes:     Conjunctiva/sclera: Conjunctivae normal.     Pupils: Pupils are equal, round, and reactive to light.  Neck:     Thyroid: No thyromegaly.  Cardiovascular:     Rate and  Rhythm: Normal rate and regular rhythm.     Heart sounds: Normal heart sounds. No murmur heard.   No friction rub. No gallop.  Pulmonary:     Effort: Pulmonary effort is normal. No respiratory distress.     Breath sounds: Normal breath sounds. No stridor. No wheezing or rales.  Abdominal:     General: Bowel sounds are normal.     Palpations: Abdomen is soft.  Musculoskeletal:        General: Normal range of motion.     Cervical back: Neck supple.  Feet:     Comments: Normal monofilament testing Skin:    General: Skin is warm and dry.  Neurological:     Mental Status: He is alert and oriented to person, place, and time.  Psychiatric:        Behavior: Behavior normal.        Thought Content: Thought content normal.        Judgment: Judgment normal.    Assessment/Plan: Health Maintenance Due  Topic Date Due   COLONOSCOPY (Pts 45-17yr Insurance coverage will need to be confirmed)  Never done   Health Maintenance reviewed - patient declines prevnar 20,but would consider in fall when he gets flu shot.  1. Preventative health care He plans to work on daily walking and getting back on track with eating. Motivated to work on sugar control and decrease med burden overall.   2. Vitamin D deficiency - VITAMIN D 25 Hydroxy (Vit-D Deficiency, Fractures); Future  3. Vitamin B12 deficiency - CBC with Differential/Platelet; Future - Vitamin B12;  Future  4. Type 2 diabetes mellitus with hyperglycemia, without long-term current use of insulin ([facility]) Uncontrolled. POC A1C today was 11.4. we are going to add jardiance [id number]. Discussed new medication(s) today with patient. Discussed potential side effects and patient verbalized understanding. Samples were given today for 4 week trial. Savings card given for future rx. He will update me in 2-3 weeks to let me know how he is doing with medications.  - [facility] DIABETES FOOT EXAM - Microalbumin / creatinine urine ratio; Future  5. Type 2 diabetes mellitus without complication, without long-term current use of insulin ([place].discussed dietary changes, limiting carb choices to 3 per meal. He is open to seeing dietician if sugars are not improving with above. We are also going to change janumet to xr for simplicity of daily dosing. - blood glucose meter kit and supplies KIT; Dispense based on patient and insurance preference. Use up to four times daily as directed. (FOR ICD-9 250.00, 250.01).  Dispense: 1 each; Refill: 0 - POC HgB A1c  6. Hyperlipidemia associated with type 2 diabetes mellitus ([place]- Comprehensive metabolic panel; Future - Lipid panel; Future - rosuvastatin (CRESTOR) 10 MG tablet; Take 1 tablet (10 mg total) by mouth daily.  Dispense: 90 tablet; Refill: 1    Return in about 3 months (around 12/26/2021) for Chronic condition visit.  JuMicheline RoughMD

## 2021-09-28 NOTE — Addendum Note (Signed)
Addended by: Marian Sorrow D on: 09/28/2021 09:21 AM   Modules accepted: Orders

## 2021-09-28 NOTE — Patient Instructions (Signed)
Goal for sugars: fasting and 2 hour post-meal sugars should be less than 160. (Normal is less than 126)

## 2021-09-28 NOTE — Progress Notes (Signed)
Spoke with Gerald Stabs, pharmacist at [facility] and cancelled both Rxs as below.

## 2021-09-30 MED ORDER — VITAMIN D (ERGOCALCIFEROL) 1.25 MG (50000 UNIT) PO CAPS: 50000.0000 [IU] | ORAL_CAPSULE | ORAL | 0 refills | Status: DC

## 2021-09-30 MED ORDER — ROSUVASTATIN CALCIUM 20 MG PO TABS: 20.0000 mg | ORAL_TABLET | Freq: Every day | ORAL | 1 refills | Status: DC

## 2021-09-30 NOTE — Addendum Note (Signed)
Addended by: Johnella Moloney on: 09/30/2021 02:39 PM   Modules accepted: Orders

## 2021-10-01 ENCOUNTER — Encounter: Payer: Self-pay | Admitting: Family Medicine

## 2021-10-01 DIAGNOSIS — E119 Type 2 diabetes mellitus without complications: Secondary | ICD-10-CM

## 2021-10-06 MED ORDER — JANUMET 50-500 MG PO TABS: 1.0000 | ORAL_TABLET | Freq: Two times a day (BID) | ORAL | 3 refills | Status: DC

## 2021-10-06 MED ORDER — FLUCONAZOLE 150 MG PO TABS: 150.0000 mg | ORAL_TABLET | Freq: Once | ORAL | 0 refills | Status: AC

## 2021-10-06 MED ORDER — BLOOD GLUCOSE MONITOR KIT: PACK | 0 refills | Status: AC

## 2021-10-06 MED ORDER — CLOTRIMAZOLE 1 % EX CREA: 1.0000 "application " | TOPICAL_CREAM | Freq: Two times a day (BID) | CUTANEOUS | 0 refills | Status: DC

## 2021-10-06 NOTE — Telephone Encounter (Signed)
Message sent to PCP as the only samples available are for a 30-day supply.

## 2021-10-07 MED ORDER — RYBELSUS 3 MG PO TABS: 3.0000 mg | ORAL_TABLET | Freq: Every day | ORAL | 0 refills | Status: DC

## 2021-11-06 ENCOUNTER — Other Ambulatory Visit: Payer: Self-pay

## 2021-11-06 ENCOUNTER — Encounter: Payer: Self-pay | Admitting: Family Medicine

## 2021-11-06 ENCOUNTER — Ambulatory Visit (INDEPENDENT_AMBULATORY_CARE_PROVIDER_SITE_OTHER): Payer: 59 | Admitting: Family Medicine

## 2021-11-06 VITALS — BP 120/70 | HR 74 | Temp 97.6°F | Resp 16 | Ht 68.0 in | Wt 174.5 lb

## 2021-11-06 DIAGNOSIS — H60502 Unspecified acute noninfective otitis externa, left ear: Secondary | ICD-10-CM | POA: Diagnosis not present

## 2021-11-06 DIAGNOSIS — H6982 Other specified disorders of Eustachian tube, left ear: Secondary | ICD-10-CM | POA: Diagnosis not present

## 2021-11-06 MED ORDER — JANUMET 50-500 MG PO TABS: 1.0000 | ORAL_TABLET | Freq: Two times a day (BID) | ORAL | 3 refills | Status: DC

## 2021-11-06 MED ORDER — CIPROFLOXACIN-DEXAMETHASONE 0.3-0.1 % OT SUSP: 4.0000 [drp] | Freq: Two times a day (BID) | OTIC | 0 refills | Status: DC

## 2021-11-06 NOTE — Patient Instructions (Addendum)
A few things to remember from today's visit: ? ?Acute otitis externa of left ear, unspecified type - Plan: ciprofloxacin-dexamethasone (CIPRODEX) OTIC suspension ? ?Dysfunction of left eustachian tube ? ?Do not use My Chart to request refills or for acute issues that need immediate attention. ? ?Please be sure medication list is accurate. ?I may help to pop your ears a few times during the day, chew gum, or eating hard candy. ?Avoid earplugs. ? ?If a new problem present, please set up appointment sooner than planned today. ?Eustachian Tube Dysfunction ?Eustachian tube dysfunction refers to a condition in which a blockage develops in the narrow passage that connects the middle ear to the back of the nose (eustachian tube). The eustachian tube regulates air pressure in the middle ear by letting air move between the ear and nose. It also helps to drain fluid from the middle ear space. ?Eustachian tube dysfunction can affect one or both ears. When the eustachian tube does not function properly, air pressure, fluid, or both can build up in the middle ear. ?What are the causes? ?This condition occurs when the eustachian tube becomes blocked or cannot open normally. Common causes of this condition include: ?Ear infections. ?Colds and other infections that affect the nose, mouth, and throat (upper respiratory tract). ?Allergies. ?Irritation from cigarette smoke. ?Irritation from stomach acid coming up into the esophagus (gastroesophageal reflux). The esophagus is the part of the body that moves food from the mouth to the stomach. ?Sudden changes in air pressure, such as from descending in an airplane or scuba diving. ?Abnormal growths in the nose or throat, such as: ?Growths that line the nose (nasal polyps). ?Abnormal growth of cells (tumors). ?Enlarged tissue at the back of the throat (adenoids). ?What increases the risk? ?You are more likely to develop this condition if: ?You smoke. ?You are overweight. ?You are a child  who has: ?Certain birth defects of the mouth, such as cleft palate. ?Large tonsils or adenoids. ?What are the signs or symptoms? ?Common symptoms of this condition include: ?A feeling of fullness in the ear. ?Ear pain. ?Clicking or popping noises in the ear. ?Ringing in the ear (tinnitus). ?Hearing loss. ?Loss of balance. ?Dizziness. ?Symptoms may get worse when the air pressure around you changes, such as when you travel to an area of high elevation, fly on an airplane, or go scuba diving. ?How is this diagnosed? ?This condition may be diagnosed based on: ?Your symptoms. ?A physical exam of your ears, nose, and throat. ?Tests, such as those that measure: ?The movement of your eardrum. ?Your hearing (audiometry). ?How is this treated? ?Treatment depends on the cause and severity of your condition. ?In mild cases, you may relieve your symptoms by moving air into your ears. This is called "popping the ears." ?In more severe cases, or if you have symptoms of fluid in your ears, treatment may include: ?Medicines to relieve congestion (decongestants). ?Medicines that treat allergies (antihistamines). ?Nasal sprays or ear drops that contain medicines that reduce swelling (steroids). ?A procedure to drain the fluid in your eardrum. In this procedure, a small tube may be placed in the eardrum to: ?Drain the fluid. ?Restore the air in the middle ear space. ?A procedure to insert a balloon device through the nose to inflate the opening of the eustachian tube (balloon dilation). ?Follow these instructions at home: ?Lifestyle ?Do not do any of the following until your health care provider approves: ?Travel to high altitudes. ?Fly in airplanes. ?Work in a Estate agent or room. ?  Scuba dive. ?Do not use any products that contain nicotine or tobacco. These products include cigarettes, chewing tobacco, and vaping devices, such as e-cigarettes. If you need help quitting, ask your health care provider. ?Keep your ears dry. Wear  fitted earplugs during showering and bathing. Dry your ears completely after. ?General instructions ?Take over-the-counter and prescription medicines only as told by your health care provider. ?Use techniques to help pop your ears as recommended by your health care provider. These may include: ?Chewing gum. ?Yawning. ?Frequent, forceful swallowing. ?Closing your mouth, holding your nose closed, and gently blowing as if you are trying to blow air out of your nose. ?Keep all follow-up visits. This is important. ?Contact a health care provider if: ?Your symptoms do not go away after treatment. ?Your symptoms come back after treatment. ?You are unable to pop your ears. ?You have: ?A fever. ?Pain in your ear. ?Pain in your head or neck. ?Fluid draining from your ear. ?Your hearing suddenly changes. ?You become very dizzy. ?You lose your balance. ?Get help right away if: ?You have a sudden, severe increase in any of your symptoms. ?Summary ?Eustachian tube dysfunction refers to a condition in which a blockage develops in the eustachian tube. ?It can be caused by ear infections, allergies, inhaled irritants, or abnormal growths in the nose or throat. ?Symptoms may include ear pain or fullness, hearing loss, or ringing in the ears. ?Mild cases are treated with techniques to unblock the ears, such as yawning or chewing gum. ?More severe cases are treated with medicines or procedures. ?This information is not intended to replace advice given to you by your health care provider. Make sure you discuss any questions you have with your health care provider. ?Document Revised: 10/20/2020 Document Reviewed: 10/20/2020 ?Elsevier Patient Education ? 2022 Elsevier Inc. ? ? ? ? ? ? ? ?

## 2021-11-06 NOTE — Progress Notes (Signed)
?ACUTE VISIT ?Chief Complaint  ?Patient presents with  ? Otalgia  ?  Started on Monday, left ear. Just returned from traveling.   ? ?HPI: ?[name] is a 46 y.o. male with hx of HTN , DM II, and HLD here today complaining of 5 days of left earache.  ?He arrived today, he did fly from [place]. ?Used earplugs as he usually does, no hx of trauma. ?Pain is exacerbated by pressing preauricular area and behring ear. ?Pain is intermittent, 5/10. ? ?Left fullness sensation. ? ?Otalgia  ?There is pain in the left ear. This is a new problem. The current episode started in the past 7 days. There has been no fever. Pertinent negatives include no abdominal pain, coughing, diarrhea, ear discharge, headaches, hearing loss, neck pain, rash, rhinorrhea, sore throat or vomiting. He has tried NSAIDs for the symptoms. The treatment provided moderate relief. There is no history of a chronic ear infection, hearing loss or a tympanostomy tube.  ?Hx of allergies, exacerbated by working outdoors during this time of the year. Usually well controlled woth OTC antihistaminic. ?Advil helped. ? ?Review of Systems  ?HENT:  Positive for ear pain. Negative for ear discharge, hearing loss, rhinorrhea, sore throat and tinnitus.   ?Eyes:  Negative for redness and itching.  ?Respiratory:  Negative for cough, shortness of breath and wheezing.   ?Gastrointestinal:  Negative for abdominal pain, diarrhea and vomiting.  ?Musculoskeletal:  Negative for gait problem and neck pain.  ?Skin:  Negative for rash.  ?Allergic/Immunologic: Positive for environmental allergies.  ?Neurological:  Negative for dizziness, syncope, weakness and headaches.  ?Rest see pertinent positives and negatives per HPI. ? ?Current Outpatient Medications on File Prior to Visit  ?Medication Sig Dispense Refill  ? albuterol (VENTOLIN HFA) 108 (90 Base) MCG/ACT inhaler TAKE 2 PUFFS BY MOUTH EVERY 6 HOURS AS NEEDED FOR WHEEZE OR SHORTNESS OF BREATH 8.5 each 2  ? blood glucose meter  kit and supplies KIT Dispense based on patient and insurance preference. Use up to four times daily as directed. (FOR ICD-9 250.00, 250.01). 1 each 0  ? clotrimazole (LOTRIMIN) 1 % cream Apply 1 application topically 2 (two) times daily. 30 g 0  ? Fluocinolone Acetonide Scalp 0.01 % OIL Apply 1 application topically daily as needed. 118.28 mL 2  ? losartan (COZAAR) 100 MG tablet TAKE 1 TABLET BY MOUTH  DAILY 90 tablet 1  ? rosuvastatin (CRESTOR) 20 MG tablet Take 1 tablet (20 mg total) by mouth daily. 90 tablet 1  ? Semaglutide (RYBELSUS) 3 MG TABS Take 3 mg by mouth daily. 30 tablet 0  ? triamcinolone cream (KENALOG) 0.1 % Apply topically 2 (two) times daily. APPLY 1 APPLICATION TOPICALLY TWICE A DAY 30 g 0  ? Vitamin D, Ergocalciferol, (DRISDOL) 1.25 MG (50000 UNIT) CAPS capsule Take 1 capsule (50,000 Units total) by mouth every 7 (seven) days. 12 capsule 0  ? ?No current facility-administered medications on file prior to visit.  ? ?Past Medical History:  ?Diagnosis Date  ? BMI 28.0-28.9,adult 06/11/2016  ? Essential hypertension 06/11/2016  ? Hyperlipemia 03/08/2016  ? Type 2 diabetes mellitus without complication, without long-term current use of insulin ([place]) 03/08/2016  ? ?Allergies  ?Allergen Reactions  ? Jardiance [Empagliflozin]   ?  Yeast infection  ? ? ?Social History  ? ?Socioeconomic History  ? Marital status: Married  ?  Spouse name: Not on file  ? Number of children: 1  ? Years of education: Not on file  ? Highest education  level: Not on file  ?Occupational History  ? Not on file  ?Tobacco Use  ? Smoking status: Never  ? Smokeless tobacco: Never  ?Vaping Use  ? Vaping Use: Never used  ?Substance and Sexual Activity  ? Alcohol use: No  ? Drug use: No  ? Sexual activity: Not on file  ?Other Topics Concern  ? Not on file  ?Social History Narrative  ? Work or School: Chief Financial Officer - industrial  ?   ? Home Situation: lives with wife and son  ?   ? Spiritual Beliefs: Hindu  ?   ? Lifestyle: in 2017 dx with  diabetes, working on improving diet and exercises  ? ?Social Determinants of Health  ? ?Financial Resource Strain: Not on file  ?Food Insecurity: Not on file  ?Transportation Needs: Not on file  ?Physical Activity: Not on file  ?Stress: Not on file  ?Social Connections: Not on file  ? ?Vitals:  ? 11/06/21 1150  ?BP: 120/70  ?Pulse: 74  ?Resp: 16  ?Temp: 97.6 ?F (36.4 ?C)  ?SpO2: 98%  ? ?Body mass index is 26.53 kg/m?. ? ?Physical Exam ?Vitals and nursing note reviewed.  ?Constitutional:   ?   General: He is not in acute distress. ?   Appearance: He is well-developed. He is not ill-appearing.  ?HENT:  ?   Head: Normocephalic and atraumatic.  ?   Right Ear: Hearing, tympanic membrane, ear canal and external ear normal.  ?   Left Ear: Hearing and external ear normal. No mastoid tenderness. Tympanic membrane is bulging (Mild). Tympanic membrane is not erythematous.  ?   Ears:  ?   Comments: Left ear canal with mild tenderness upon otoscopic examination,pressing tragus,and pulling ear lobe. Mild erythema on ear canal. No edema.  ?   Mouth/Throat:  ?   Mouth: Mucous membranes are moist.  ?   Pharynx: Oropharynx is clear.  ?Eyes:  ?   Conjunctiva/sclera: Conjunctivae normal.  ?Cardiovascular:  ?   Rate and Rhythm: Normal rate and regular rhythm.  ?   Heart sounds: No murmur heard. ?Pulmonary:  ?   Effort: Pulmonary effort is normal. No respiratory distress.  ?   Breath sounds: Normal breath sounds. No stridor.  ?Lymphadenopathy:  ?   Head:  ?   Right side of head: No preauricular or posterior auricular adenopathy.  ?   Left side of head: No preauricular or posterior auricular adenopathy.  ?   Cervical: No cervical adenopathy.  ?Skin: ?   General: Skin is warm.  ?   Findings: No erythema or rash.  ?Neurological:  ?   Mental Status: He is alert and oriented to person, place, and time.  ?   Gait: Gait normal.  ?Psychiatric:  ?   Comments: Well groomed, good eye contact.  ? ?ASSESSMENT AND PLAN: ? ?[name] was seen today  for otalgia. ? ?Diagnoses and all orders for this visit: ? ?Acute otitis externa of left ear, unspecified type ?Mild. ?Ciprodex bid x 7 d recommended. ?Continue Advil 200-400 mg tid with food if needed for pain. ?Avoid earplugs use. ?Instructed about warning signs. ? ?-     ciprofloxacin-dexamethasone (CIPRODEX) OTIC suspension; Place 4 drops into the left ear 2 (two) times daily for 7 days. ? ?Dysfunction of left eustachian tube ?Autoinflation maneuvers a few times during the day recommended. ?Chewing gum while flying may help. ?Instructed about earning signs. ? ?Return if symptoms worsen or fail to improve. ? ?Seaira Byus G. [place], MD ? ?[facility]. ?[place] office. ? ? ?

## 2021-11-10 ENCOUNTER — Encounter: Payer: Self-pay | Admitting: Family Medicine

## 2021-11-10 MED ORDER — NEOMYCIN-POLYMYXIN-HC 3.5-10000-1 OT SOLN: OTIC | 0 refills | Status: DC

## 2021-12-21 ENCOUNTER — Ambulatory Visit (INDEPENDENT_AMBULATORY_CARE_PROVIDER_SITE_OTHER): Payer: 59 | Admitting: Family Medicine

## 2021-12-21 ENCOUNTER — Encounter: Payer: Self-pay | Admitting: Family Medicine

## 2021-12-21 VITALS — BP 140/90 | HR 90 | Temp 98.1°F | Ht 68.0 in | Wt 176.4 lb

## 2021-12-21 DIAGNOSIS — E1159 Type 2 diabetes mellitus with other circulatory complications: Secondary | ICD-10-CM

## 2021-12-21 DIAGNOSIS — E119 Type 2 diabetes mellitus without complications: Secondary | ICD-10-CM

## 2021-12-21 DIAGNOSIS — I152 Hypertension secondary to endocrine disorders: Secondary | ICD-10-CM

## 2021-12-21 DIAGNOSIS — E1169 Type 2 diabetes mellitus with other specified complication: Secondary | ICD-10-CM

## 2021-12-21 DIAGNOSIS — E785 Hyperlipidemia, unspecified: Secondary | ICD-10-CM

## 2021-12-21 LAB — POCT GLYCOSYLATED HEMOGLOBIN (HGB A1C): Hemoglobin A1C: 8.5 % — AB (ref 4.0–5.6)

## 2021-12-21 MED ORDER — ROSUVASTATIN CALCIUM 10 MG PO TABS: 10.0000 mg | ORAL_TABLET | Freq: Every day | ORAL | 1 refills | Status: DC

## 2021-12-21 NOTE — Progress Notes (Signed)
?Martin Sanchez ?DOB: [birth year] ?Encounter date: 12/21/2021 ? ?This is a 46 y.o. male who presents with ?Chief Complaint  ?Patient presents with  ? Follow-up  ? ? ?History of present illness: ? ?Last visit was 09/28/2021.  At that time he was taking Janumet 50-100 twice daily.  He had not been checking sugars regularly and previous A1c was uncontrolled.  We tried to add Jardiance 25 mg daily, but he developed a yeast infection with this. (Stopped this for a week and a half before starting rybelsus and then had root canal so delayed with starting rybelsus). Then started with vomiting bug, so initially thought that it was related to medication. Just really doesn't want to be on more medication.  ? ?Has been going to gym every day, exercising at least 5 days/week on treadmill. Has cut back some of unhealthy foods and cut back on rice/carbs.  ? ?Not having night time urination. Going back to sleep easier at night.  ? ?He is more motivated now; not sure if related to feeling better or not wanting to take medication. ? ? ? ?Hypertension: he is still taking losartan, but he has been very stressed about this visit.  ?Hyperlipidemia: didn't start new updated cholesterol med dose.  ? ?Allergies  ?Allergen Reactions  ? Cortisporin [Neomycin-Polymyxin-Hc]   ?  Jittery, anxious  ? Jardiance [Empagliflozin]   ?  Yeast infection  ? ?Current Meds  ?Medication Sig  ? albuterol (VENTOLIN HFA) 108 (90 Base) MCG/ACT inhaler TAKE 2 PUFFS BY MOUTH EVERY 6 HOURS AS NEEDED FOR WHEEZE OR SHORTNESS OF BREATH  ? blood glucose meter kit and supplies KIT Dispense based on patient and insurance preference. Use up to four times daily as directed. (FOR ICD-9 250.00, 250.01).  ? clotrimazole (LOTRIMIN) 1 % cream Apply 1 application topically 2 (two) times daily.  ? Fluocinolone Acetonide Scalp 0.01 % OIL Apply 1 application topically daily as needed.  ? losartan (COZAAR) 100 MG tablet TAKE 1 TABLET BY MOUTH  DAILY  ? rosuvastatin (CRESTOR) 10 MG tablet  Take 1 tablet (10 mg total) by mouth daily.  ? sitaGLIPtin-metformin (JANUMET) 50-500 MG tablet Take 1 tablet by mouth 2 (two) times daily with a meal.  ? triamcinolone cream (KENALOG) 0.1 % Apply topically 2 (two) times daily. APPLY 1 APPLICATION TOPICALLY TWICE A DAY  ? [DISCONTINUED] rosuvastatin (CRESTOR) 20 MG tablet Take 1 tablet (20 mg total) by mouth daily.  ? [DISCONTINUED] Semaglutide (RYBELSUS) 3 MG TABS Take 3 mg by mouth daily.  ? ? ?Review of Systems  ?Constitutional:  Negative for chills, fatigue and fever.  ?Respiratory:  Negative for cough, chest tightness, shortness of breath and wheezing.   ?Cardiovascular:  Negative for chest pain, palpitations and leg swelling.  ? ?Objective: ? ?BP 140/90   Pulse 90   Temp 98.1 ?F (36.7 ?C) (Oral)   Ht 5' 8"  (1.727 m)   Wt 176 lb 6.4 oz (80 kg)   SpO2 99%   BMI 26.82 kg/m?   Weight: 176 lb 6.4 oz (80 kg)  ? ?BP Readings from Last 3 Encounters:  ?12/21/21 140/90  ?11/06/21 120/70  ?09/28/21 132/88  ? ?Wt Readings from Last 3 Encounters:  ?12/21/21 176 lb 6.4 oz (80 kg)  ?11/06/21 174 lb 8 oz (79.2 kg)  ?09/28/21 174 lb 1.6 oz (79 kg)  ? ? ?Physical Exam ?Constitutional:   ?   General: He is not in acute distress. ?   Appearance: He is well-developed.  ?Cardiovascular:  ?  Rate and Rhythm: Normal rate and regular rhythm.  ?   Heart sounds: Normal heart sounds. No murmur heard. ?  No friction rub.  ?Pulmonary:  ?   Effort: Pulmonary effort is normal. No respiratory distress.  ?   Breath sounds: Normal breath sounds. No wheezing or rales.  ?Musculoskeletal:  ?   Right lower leg: No edema.  ?   Left lower leg: No edema.  ?Neurological:  ?   Mental Status: He is alert and oriented to person, place, and time.  ?Psychiatric:     ?   Behavior: Behavior normal.  ? ? ?Assessment/Plan ? ?1. Hypertension associated with diabetes ([place]) ?Bp elevated today, but he was very stressed for visit today. Bp is coming down as he sits here in the office. I have encouraged him to  check at home and record. Can bring back to the office for next visit but will let us know if elevated at home. ?- Comprehensive metabolic panel; Future ? ?2. Type 2 diabetes mellitus without complication, without long-term current use of insulin ([place]) ?He is very motivated to work on [employer], wants to limit new medications. He will continue on janumet 50-500 BID.  ?- POC HgB A1c ?- Hemoglobin A1c; Future ? ?3. Hyperlipidemia associated with type 2 diabetes mellitus ([place]) ?He wants to decrease back to 53m and see what he can do with diet for 3 months rather than increase to 250mat this time.  ?- rosuvastatin (CRESTOR) 10 MG tablet; Take 1 tablet (10 mg total) by mouth daily.  Dispense: 90 tablet; Refill: 1 ?- Lipid panel; Future ? ?Return for lab visit in 3 months and then follow up with Dr. MiLegrand Como? ? ? ?JuMicheline RoughMD ?

## 2021-12-21 NOTE — Patient Instructions (Signed)
*  Keep up the great work with exercise and healthy eating.  ? ?*check blood pressures at home: we would like you to be somewhere in the 110-125/65-80 range. Bring cuff and readings to your next visit. Let me know if your numbers are regularly higher than this at home.  ?

## 2022-01-06 ENCOUNTER — Other Ambulatory Visit: Payer: Self-pay | Admitting: Family Medicine

## 2022-01-06 ENCOUNTER — Encounter: Payer: Self-pay | Admitting: Family Medicine

## 2022-01-07 NOTE — Telephone Encounter (Signed)
See if patient can talk with me via video or phone today - has been a year since was seen by me and want to ensure we provide the best RX for the situation. thanks!

## 2022-01-07 NOTE — Telephone Encounter (Signed)
Message sent to PCP previously for refill.

## 2022-01-08 MED ORDER — TRIAMCINOLONE ACETONIDE 0.1 % EX CREA: TOPICAL_CREAM | Freq: Two times a day (BID) | CUTANEOUS | 0 refills | Status: AC

## 2022-04-07 ENCOUNTER — Other Ambulatory Visit: Payer: 59

## 2022-05-12 ENCOUNTER — Other Ambulatory Visit: Payer: Self-pay | Admitting: *Deleted

## 2022-05-12 DIAGNOSIS — E1169 Type 2 diabetes mellitus with other specified complication: Secondary | ICD-10-CM

## 2022-05-13 MED ORDER — ROSUVASTATIN CALCIUM 10 MG PO TABS: 10.0000 mg | ORAL_TABLET | Freq: Every day | ORAL | 0 refills | Status: DC

## 2022-07-13 ENCOUNTER — Other Ambulatory Visit: Payer: Self-pay | Admitting: Family Medicine

## 2022-07-13 DIAGNOSIS — E1169 Type 2 diabetes mellitus with other specified complication: Secondary | ICD-10-CM

## 2022-08-03 ENCOUNTER — Other Ambulatory Visit: Payer: Self-pay | Admitting: Family Medicine

## 2022-08-03 DIAGNOSIS — E1169 Type 2 diabetes mellitus with other specified complication: Secondary | ICD-10-CM

## 2022-08-24 ENCOUNTER — Other Ambulatory Visit: Payer: Self-pay | Admitting: Family Medicine

## 2022-08-24 DIAGNOSIS — E1169 Type 2 diabetes mellitus with other specified complication: Secondary | ICD-10-CM

## 2022-08-25 ENCOUNTER — Other Ambulatory Visit: Payer: Self-pay | Admitting: *Deleted

## 2022-08-25 MED ORDER — JANUMET 50-500 MG PO TABS: 1.0000 | ORAL_TABLET | Freq: Two times a day (BID) | ORAL | 0 refills | Status: DC

## 2022-08-25 NOTE — Telephone Encounter (Signed)
Ok to fill but he needs an appt-- is overdue for his 6 month follow up

## 2022-08-25 NOTE — Telephone Encounter (Signed)
Ok to fill but he needs an appt with me

## 2022-09-15 ENCOUNTER — Other Ambulatory Visit: Payer: Self-pay | Admitting: Family Medicine

## 2022-09-15 DIAGNOSIS — E1169 Type 2 diabetes mellitus with other specified complication: Secondary | ICD-10-CM

## 2022-10-05 ENCOUNTER — Other Ambulatory Visit: Payer: Self-pay | Admitting: Family Medicine

## 2022-10-05 DIAGNOSIS — E1169 Type 2 diabetes mellitus with other specified complication: Secondary | ICD-10-CM

## 2022-10-15 ENCOUNTER — Ambulatory Visit (INDEPENDENT_AMBULATORY_CARE_PROVIDER_SITE_OTHER): Payer: 59 | Admitting: Family Medicine

## 2022-10-15 ENCOUNTER — Encounter: Payer: Self-pay | Admitting: Family Medicine

## 2022-10-15 VITALS — BP 150/100 | HR 115 | Temp 98.7°F | Ht 68.0 in | Wt 170.9 lb

## 2022-10-15 DIAGNOSIS — E119 Type 2 diabetes mellitus without complications: Secondary | ICD-10-CM | POA: Diagnosis not present

## 2022-10-15 DIAGNOSIS — I10 Essential (primary) hypertension: Secondary | ICD-10-CM

## 2022-10-15 LAB — POCT GLYCOSYLATED HEMOGLOBIN (HGB A1C): Hemoglobin A1C: 11.6 % — AB (ref 4.0–5.6)

## 2022-10-15 NOTE — Patient Instructions (Addendum)
Check blood pressure every day at home (feet flat on floor resting for 5 minutes).   Melatonin at night -- start with 5 mg, may increase up to 20 mg at bedtime.   Corcidin HBP-- decongestent/ cold medicine that will not raise your blood pressure.

## 2022-10-15 NOTE — Progress Notes (Signed)
Established Patient Office Visit  Subjective   Patient ID: Martin Sanchez, male    DOB: Nov 30, 1975  Age: 47 y.o. MRN: [id number]  Chief Complaint  Patient presents with  . Establish Care    Patient is here for follow up for his diabetes and his HTN..  DM - A1C performed today and is elevated today. He reports he has had a very stressful time in the last several months or so. States that   HTN--BP performed in office and is elevated today, he reports he is compliant with the medication    Current Outpatient Medications  Medication Instructions  . albuterol (VENTOLIN HFA) 108 (90 Base) MCG/ACT inhaler TAKE 2 PUFFS BY MOUTH EVERY 6 HOURS AS NEEDED FOR WHEEZE OR SHORTNESS OF BREATH  . blood glucose meter kit and supplies KIT Dispense based on patient and insurance preference. Use up to four times daily as directed. (FOR ICD-9 250.00, 250.01).  . clotrimazole (LOTRIMIN) 1 % cream 1 application , Topical, 2 times daily  . Fluocinolone Acetonide Scalp [id number] % OIL 1 application , Apply externally, Daily PRN  . losartan (COZAAR) 100 MG tablet TAKE 1 TABLET BY MOUTH DAILY  . rosuvastatin (CRESTOR) 10 mg, Oral, Daily  . sitaGLIPtin-metformin (JANUMET) 50-500 MG tablet 1 tablet, Oral, 2 times daily with meals  . triamcinolone cream (KENALOG) 0.1 % Topical, 2 times daily, APPLY 1 APPLICATION TOPICALLY TWICE A DAY    Patient Active Problem List   Diagnosis Date Noted  . Adenopathy 07/24/2018  . LAD (lymphadenopathy), inguinal 06/16/2017  . Hyperlipidemia associated with type 2 diabetes mellitus ([place]) 03/25/2017  . Hypertension associated with diabetes ([place]) 06/11/2016  . BMI 28.0-28.9,adult 06/11/2016  . Type 2 diabetes mellitus without complication, without long-term current use of insulin ([place]) 03/08/2016  . Carpal tunnel syndrome of right wrist 11/24/2010      Review of Systems  All other systems reviewed and are negative.     Objective:     BP (!) 150/100 (BP Location: Right Arm,  Patient Position: Sitting, Cuff Size: Normal)   Pulse (!) 115   Temp 98.7 F (37.1 C) (Oral)   Ht '5\' 8"'$  (1.727 m)   Wt 170 lb 14.4 oz (77.5 kg)   SpO2 99%   BMI 25.99 kg/m  {Vitals History (Optional):23777}  Physical Exam Vitals reviewed.  Constitutional:      Appearance: Normal appearance. He is well-groomed and normal weight.  Eyes:     Extraocular Movements: Extraocular movements intact.     Conjunctiva/sclera: Conjunctivae normal.  Neck:     Thyroid: No thyromegaly.  Cardiovascular:     Rate and Rhythm: Normal rate and regular rhythm.     Heart sounds: S1 normal and S2 normal. No murmur heard. Pulmonary:     Effort: Pulmonary effort is normal.     Breath sounds: Normal breath sounds and air entry. No rales.  Abdominal:     General: Abdomen is flat. Bowel sounds are normal.  Musculoskeletal:     Right lower leg: No edema.     Left lower leg: No edema.  Neurological:     General: No focal deficit present.     Mental Status: He is alert and oriented to person, place, and time.     Gait: Gait is intact.  Psychiatric:        Mood and Affect: Mood and affect normal.     Results for orders placed or performed in visit on 10/15/22  POC HgB A1c  Result  Value Ref Range   Hemoglobin A1C 11.6 (A) 4.0 - 5.6 %   HbA1c POC (<> result, manual entry)     HbA1c, POC (prediabetic range)     HbA1c, POC (controlled diabetic range)      {Labs (Optional):23779}  The 10-year ASCVD risk score (Arnett DK, et al., 2019) is: 9.9%    Assessment & Plan:   Problem List Items Addressed This Visit       Unprioritized   Type 2 diabetes mellitus without complication, without long-term current use of insulin ([place]) - Primary   Relevant Orders   POC HgB A1c (Completed)    Return in about 3 months (around 01/13/2023) for annual physical exam.    Farrel Conners, MD

## 2022-10-18 ENCOUNTER — Encounter: Payer: Self-pay | Admitting: Family Medicine

## 2022-10-18 NOTE — Assessment & Plan Note (Signed)
BP is elevated today, he reports that his BP readings at home are better. I encouraged him to continue checking his blood pressure at home and he is to contact me with his BP readings at home.

## 2022-10-18 NOTE — Assessment & Plan Note (Signed)
A1cC is much worse than previous. We discussed increasing his medication however patient wants to try going back on his diet and exercise program first before increasing his medication. States that he has had some stress at home and he was having trouble controlling his diet. Pt is requesting 3 months to get back on his diet before we consider adjusting his meds. I will see him back in 3 months for his annual physical. Will recheck A1C then.

## 2022-11-29 ENCOUNTER — Encounter (HOSPITAL_COMMUNITY): Payer: Self-pay

## 2022-11-29 ENCOUNTER — Ambulatory Visit (HOSPITAL_COMMUNITY)
Admission: EM | Admit: 2022-11-29 | Discharge: 2022-11-29 | Disposition: A | Discharge status: Home / Self Care | Payer: 59 | Attending: Emergency Medicine | Admitting: Emergency Medicine

## 2022-11-29 DIAGNOSIS — L02411 Cutaneous abscess of right axilla: Secondary | ICD-10-CM | POA: Diagnosis not present

## 2022-11-29 MED ORDER — DOXYCYCLINE HYCLATE 100 MG PO CAPS: 100.0000 mg | ORAL_CAPSULE | Freq: Two times a day (BID) | ORAL | 0 refills | Status: DC

## 2022-11-29 NOTE — ED Triage Notes (Signed)
Patient c/o right axillary abscess x 3 days.

## 2022-11-29 NOTE — ED Provider Notes (Signed)
[facility]    CSN: [id number] Arrival date & time: 11/29/22  1730      History   Chief Complaint Chief Complaint  Patient presents with   Abscess    HPI Martin Sanchez is a 47 y.o. male.   Patient presents for evaluation of cyst present to the right axilla beginning 2 to 3 days ago.  Over the last 24 hours has become erythematous and tender, can be felt with movement.  Has attempted use of Neosporin which has been minimally effective.  Denies fevers or drainage.  First occurrence.  Past Medical History:  Diagnosis Date   BMI 28.0-28.9,adult 06/11/2016   Essential hypertension 06/11/2016   Hyperlipemia 03/08/2016   Type 2 diabetes mellitus without complication, without long-term current use of insulin 03/08/2016    Patient Active Problem List   Diagnosis Date Noted   Adenopathy 07/24/2018   LAD (lymphadenopathy), inguinal 06/16/2017   HLD (hyperlipidemia) 03/25/2017   HTN (hypertension) 06/11/2016   BMI 28.0-28.9,adult 06/11/2016   Type 2 diabetes mellitus without complication, without long-term current use of insulin 03/08/2016   Carpal tunnel syndrome of right wrist 11/24/2010    Past Surgical History:  Procedure Laterality Date   LYMPH NODE BIOPSY Right 05/13/2017       Home Medications    Prior to Admission medications   Medication Sig Start Date End Date Taking? Authorizing Provider  doxycycline (VIBRAMYCIN) 100 MG capsule Take 1 capsule (100 mg total) by mouth 2 (two) times daily. 11/29/22  Yes Myrtle Haller R, NP  albuterol (VENTOLIN HFA) 108 (90 Base) MCG/ACT inhaler TAKE 2 PUFFS BY MOUTH EVERY 6 HOURS AS NEEDED FOR WHEEZE OR SHORTNESS OF BREATH 01/22/21   Wynn BankerKoberlein, Junell C, MD  blood glucose meter kit and supplies KIT Dispense based on patient and insurance preference. Use up to four times daily as directed. (FOR ICD-9 250.00, 250.01). 10/06/21   Theodis ShoveKoberlein, Junell C, MD  clotrimazole (LOTRIMIN) 1 % cream Apply 1 application topically 2 (two) times  daily. 10/06/21   Wynn BankerKoberlein, Junell C, MD  Fluocinolone Acetonide Scalp 0.01 % OIL Apply 1 application topically daily as needed. 03/28/20   Wynn BankerKoberlein, Junell C, MD  losartan (COZAAR) 100 MG tablet TAKE 1 TABLET BY MOUTH DAILY 01/06/22   Theodis ShoveKoberlein, Junell C, MD  rosuvastatin (CRESTOR) 10 MG tablet TAKE 1 TABLET BY MOUTH DAILY 10/06/22   Karie GeorgesMichael, Barbara M, MD  sitaGLIPtin-metformin [facility](JANUMET) 50-500 MG tablet Take 1 tablet by mouth 2 (two) times daily with a meal. 08/25/22   Karie GeorgesMichael, Barbara M, MD  triamcinolone cream (KENALOG) 0.1 % Apply topically 2 (two) times daily. APPLY 1 APPLICATION TOPICALLY TWICE A DAY 01/08/22   Wynn BankerKoberlein, Junell C, MD    Family History Family History  Problem Relation Age of Onset   Diabetes Father    Leukemia Father        CLL   High blood pressure Mother    High blood pressure Brother     Social History Social History   Tobacco Use   Smoking status: Never   Smokeless tobacco: Never  Vaping Use   Vaping Use: Never used  Substance Use Topics   Alcohol use: No   Drug use: No     Allergies   Cortisporin [neomycin-polymyxin-hc] and Jardiance [empagliflozin]   Review of Systems Review of Systems   Physical Exam Triage Vital Signs ED Triage Vitals  Enc Vitals Group     BP 11/29/22 1738 (!) 157/110     Pulse Rate 11/29/22  1738 99     Resp 11/29/22 1738 16     Temp 11/29/22 1738 97.7 F (36.5 C)     Temp Source 11/29/22 1738 Oral     SpO2 11/29/22 1738 99 %     Weight --      Height --      Head Circumference --      Peak Flow --      Pain Score 11/29/22 1740 8     Pain Loc --      Pain Edu? --      Excl. in GC? --    No data found.  Updated Vital Signs BP (!) 157/110 (BP Location: Left Arm)   Pulse 99   Temp 97.7 F (36.5 C) (Oral)   Resp 16   SpO2 99%   Visual Acuity Right Eye Distance:   Left Eye Distance:   Bilateral Distance:    Right Eye Near:   Left Eye Near:    Bilateral Near:     Physical Exam Constitutional:       Appearance: Normal appearance.  Eyes:     Extraocular Movements: Extraocular movements intact.  Pulmonary:     Effort: Pulmonary effort is normal.  Skin:    Comments: 2 x 3 immature and erythematous abscess present to the right axilla approximately 7:00  Neurological:     Mental Status: He is alert and oriented to person, place, and time. Mental status is at baseline.      UC Treatments / Results  Labs (all labs ordered are listed, but only abnormal results are displayed) Labs Reviewed - No data to display  EKG   Radiology No results found.  Procedures Procedures (including critical care time)  Medications Ordered in UC Medications - No data to display  Initial Impression / Assessment and Plan / UC Course  I have reviewed the triage vital signs and the nursing notes.  Pertinent labs & imaging results that were available during my care of the patient were reviewed by me and considered in my medical decision making (see chart for details).  Abscess of right axilla  Unable to complete incision and drainage due to firmness, discussed with patient, doxycycline prescribed for treatment, recommended warm compresses to the affected area to soften tissue and facilitate drainage, may use over-the-counter analgesics as needed for management of discomfort, given strict precautions for any concerns regarding a nonhealing or nondraining site to follow-up for reevaluation, verbalized understanding Final Clinical Impressions(s) / UC Diagnoses   Final diagnoses:  Abscess of axilla, right     Discharge Instructions      Today your abscess was evaluated, unfortunately it is too far to attempt to drain it as it most likely will not yield any improvement  Take doxycycline every morning and every evening for 10 days, typically takes about 48 hours for antibiotic to get in your system before you begin to see improvement  Hold warm-hot compresses to affected area at least 4 times a day,  this helps to facilitate draining, the more the better  Please return for evaluation for increased swelling, increased tenderness or pain, non healing site, non draining site, you begin to have fever or chills   We reviewed the etiology of recurrent abscesses of skin.  Skin abscesses are collections of pus within the dermis and deeper skin tissues. Skin abscesses manifest as painful, tender, fluctuant, and erythematous nodules, frequently surmounted by a pustule and surrounded by a rim of erythematous swelling.  Spontaneous drainage  of purulent material may occur.  Fever can occur on occasion.    -Skin abscesses can develop in healthy individuals with no predisposing conditions other than skin or nasal carriage of Staphylococcus aureus.  Individuals in close contact with others who have active infection with skin abscesses are at increased risk which is likely to explain why twin brother has similar episodes.   In addition, any process leading to a breach in the skin barrier can also predispose to the development of a skin abscesses, such as atopic dermatitis.      ED Prescriptions     Medication Sig Dispense Auth. Provider   doxycycline (VIBRAMYCIN) 100 MG capsule Take 1 capsule (100 mg total) by mouth 2 (two) times daily. 20 capsule Valinda Hoar, NP      PDMP not reviewed this encounter.   Valinda Hoar, [place] 11/29/22 [phone]

## 2022-11-29 NOTE — Discharge Instructions (Signed)
Today your abscess was evaluated, unfortunately it is too far to attempt to drain it as it most likely will not yield any improvement  Take doxycycline every morning and every evening for 10 days, typically takes about 48 hours for antibiotic to get in your system before you begin to see improvement  Hold warm-hot compresses to affected area at least 4 times a day, this helps to facilitate draining, the more the better  Please return for evaluation for increased swelling, increased tenderness or pain, non healing site, non draining site, you begin to have fever or chills   We reviewed the etiology of recurrent abscesses of skin.  Skin abscesses are collections of pus within the dermis and deeper skin tissues. Skin abscesses manifest as painful, tender, fluctuant, and erythematous nodules, frequently surmounted by a pustule and surrounded by a rim of erythematous swelling.  Spontaneous drainage of purulent material may occur.  Fever can occur on occasion.    -Skin abscesses can develop in healthy individuals with no predisposing conditions other than skin or nasal carriage of Staphylococcus aureus.  Individuals in close contact with others who have active infection with skin abscesses are at increased risk which is likely to explain why twin brother has similar episodes.   In addition, any process leading to a breach in the skin barrier can also predispose to the development of a skin abscesses, such as atopic dermatitis.

## 2022-12-02 ENCOUNTER — Ambulatory Visit: Payer: 59 | Admitting: Family Medicine

## 2022-12-21 ENCOUNTER — Other Ambulatory Visit: Payer: Self-pay | Admitting: *Deleted

## 2022-12-22 MED ORDER — LOSARTAN POTASSIUM 100 MG PO TABS: 100.0000 mg | ORAL_TABLET | Freq: Every day | ORAL | 1 refills | Status: DC

## 2023-01-14 ENCOUNTER — Encounter: Payer: Self-pay | Admitting: Family Medicine

## 2023-01-14 ENCOUNTER — Ambulatory Visit (INDEPENDENT_AMBULATORY_CARE_PROVIDER_SITE_OTHER): Payer: 59 | Admitting: Family Medicine

## 2023-01-14 VITALS — BP 150/96 | HR 90 | Temp 98.1°F | Ht 67.0 in | Wt 168.4 lb

## 2023-01-14 DIAGNOSIS — E559 Vitamin D deficiency, unspecified: Secondary | ICD-10-CM | POA: Diagnosis not present

## 2023-01-14 DIAGNOSIS — Z0000 Encounter for general adult medical examination without abnormal findings: Secondary | ICD-10-CM

## 2023-01-14 DIAGNOSIS — Z1211 Encounter for screening for malignant neoplasm of colon: Secondary | ICD-10-CM

## 2023-01-14 DIAGNOSIS — E538 Deficiency of other specified B group vitamins: Secondary | ICD-10-CM

## 2023-01-14 DIAGNOSIS — E119 Type 2 diabetes mellitus without complications: Secondary | ICD-10-CM | POA: Diagnosis not present

## 2023-01-14 DIAGNOSIS — Z7984 Long term (current) use of oral hypoglycemic drugs: Secondary | ICD-10-CM

## 2023-01-14 DIAGNOSIS — Z13220 Encounter for screening for lipoid disorders: Secondary | ICD-10-CM | POA: Diagnosis not present

## 2023-01-14 DIAGNOSIS — R252 Cramp and spasm: Secondary | ICD-10-CM | POA: Diagnosis not present

## 2023-01-14 LAB — COMPREHENSIVE METABOLIC PANEL
ALT: 24 U/L (ref 0–53)
AST: 19 U/L (ref 0–37)
Albumin: 4.4 g/dL (ref 3.5–5.2)
Alkaline Phosphatase: 57 U/L (ref 39–117)
BUN: 11 mg/dL (ref 6–23)
CO2: 28 mEq/L (ref 19–32)
Calcium: 9.2 mg/dL (ref 8.4–10.5)
Chloride: 98 mEq/L (ref 96–112)
Creatinine, Ser: 0.8 mg/dL (ref 0.40–1.50)
GFR: 105.02 mL/min (ref >60.00)
Glucose, Bld: 259 mg/dL — ABNORMAL HIGH (ref 70–99)
Potassium: 4.3 mEq/L (ref 3.5–5.1)
Sodium: 135 mEq/L (ref 135–145)
Total Bilirubin: 0.9 mg/dL (ref 0.2–1.2)
Total Protein: 7 g/dL (ref 6.0–8.3)

## 2023-01-14 LAB — POCT GLYCOSYLATED HEMOGLOBIN (HGB A1C): Hemoglobin A1C: 11.5 % — AB (ref 4.0–5.6)

## 2023-01-14 LAB — MAGNESIUM: Magnesium: 1.9 mg/dL (ref 1.5–2.5)

## 2023-01-14 LAB — LIPID PANEL
Cholesterol: 148 mg/dL (ref 0–200)
HDL: 40.4 mg/dL (ref >39.00)
LDL Cholesterol: 80 mg/dL (ref 0–99)
NonHDL: 107.48
Total CHOL/HDL Ratio: 4
Triglycerides: 139 mg/dL (ref 0.0–149.0)
VLDL: 27.8 mg/dL (ref 0.0–40.0)

## 2023-01-14 LAB — B12 AND FOLATE PANEL
Folate: >23.9 ng/mL (ref >5.9)
Vitamin B-12: 297 pg/mL (ref 211–911)

## 2023-01-14 LAB — CK: Total CK: 107 U/L (ref 7–232)

## 2023-01-14 LAB — VITAMIN D 25 HYDROXY (VIT D DEFICIENCY, FRACTURES): VITD: 25.08 ng/mL — ABNORMAL LOW (ref 30.00–100.00)

## 2023-01-14 LAB — MICROALBUMIN / CREATININE URINE RATIO
Creatinine,U: 59.1 mg/dL
Microalb Creat Ratio: 1.2 mg/g (ref 0.0–30.0)
Microalb, Ur: <0.7 mg/dL (ref 0.0–1.9)

## 2023-01-14 MED ORDER — EMPAGLIFLOZIN 25 MG PO TABS: 25.0000 mg | ORAL_TABLET | Freq: Every day | ORAL | 1 refills | Status: DC

## 2023-01-14 NOTE — Progress Notes (Unsigned)
Complete physical exam  Patient: Martin Sanchez   DOB: 08-Aug-1976   47 y.o. Male  MRN: [id number]  Subjective:    Chief Complaint  Patient presents with   Annual Exam    Martin Sanchez is a 47 y.o. male who presents today for a complete physical exam. He reports consuming a diabetic, low carb calorie diet. The patient does not participate in regular exercise at present. He generally feels fairly well. He reports sleeping fairly well. He does not have additional problems to discuss today.    Most recent fall risk assessment:     No data to display           Most recent depression screenings:    01/14/2023    9:22 AM 10/15/2022    2:26 PM  PHQ 2/9 Scores  PHQ - 2 Score 0 0  PHQ- 9 Score 0 2    Vision:Not within last year  and Dental: No current dental problems and Receives regular dental care  Patient Active Problem List   Diagnosis Date Noted   Adenopathy 07/24/2018   LAD (lymphadenopathy), inguinal 06/16/2017   HLD (hyperlipidemia) 03/25/2017   HTN (hypertension) 06/11/2016   BMI 28.0-28.9,adult 06/11/2016   Type 2 diabetes mellitus without complication, without long-term current use of insulin (HCC) 03/08/2016   Carpal tunnel syndrome of right wrist 11/24/2010      Patient Care Team: Karie Georges, MD as PCP - General (Family Medicine) Malachy Mood, MD as Consulting Physician (Hematology)   Outpatient Medications Prior to Visit  Medication Sig   albuterol (VENTOLIN HFA) 108 (90 Base) MCG/ACT inhaler TAKE 2 PUFFS BY MOUTH EVERY 6 HOURS AS NEEDED FOR WHEEZE OR SHORTNESS OF BREATH   blood glucose meter kit and supplies KIT Dispense based on patient and insurance preference. Use up to four times daily as directed. (FOR ICD-9 250.00, 250.01).   clotrimazole (LOTRIMIN) 1 % cream Apply 1 application topically 2 (two) times daily.   Fluocinolone Acetonide Scalp 0.01 % OIL Apply 1 application topically daily as needed.   losartan (COZAAR) 100 MG tablet Take 1 tablet (100  mg total) by mouth daily.   rosuvastatin (CRESTOR) 10 MG tablet TAKE 1 TABLET BY MOUTH DAILY   sitaGLIPtin-metformin (JANUMET) 50-500 MG tablet Take 1 tablet by mouth 2 (two) times daily with a meal.   triamcinolone cream (KENALOG) 0.1 % Apply topically 2 (two) times daily. APPLY 1 APPLICATION TOPICALLY TWICE A DAY   [DISCONTINUED] doxycycline (VIBRAMYCIN) 100 MG capsule Take 1 capsule (100 mg total) by mouth 2 (two) times daily.   No facility-administered medications prior to visit.    Review of Systems  HENT:  Negative for hearing loss.   Eyes:  Negative for blurred vision.  Respiratory:  Negative for shortness of breath.   Cardiovascular:  Negative for chest pain.  Gastrointestinal: Negative.   Genitourinary: Negative.   Musculoskeletal:  Negative for back pain.  Neurological:  Negative for headaches.  Psychiatric/Behavioral:  Negative for depression.   All other systems reviewed and are negative.      Objective:     BP (!) 150/96 (BP Location: Right Arm, Patient Position: Sitting, Cuff Size: Normal)   Pulse 90   Temp 98.1 F (36.7 C) (Oral)   Ht 5\' 7"  (1.702 m)   Wt 168 lb 6.4 oz (76.4 kg)   SpO2 99%   BMI 26.38 kg/m  BP Readings from Last 3 Encounters:  01/14/23 (!) 150/96  11/29/22 (!) 157/110  10/15/22 (!) 150/100  Physical Exam Vitals reviewed.  Constitutional:      Appearance: Normal appearance. He is well-groomed and normal weight.  HENT:     Right Ear: Tympanic membrane normal.     Left Ear: Tympanic membrane normal.     Mouth/Throat:     Mouth: Mucous membranes are moist.     Pharynx: No posterior oropharyngeal erythema.  Eyes:     Extraocular Movements: Extraocular movements intact.     Conjunctiva/sclera: Conjunctivae normal.  Neck:     Thyroid: No thyromegaly.  Cardiovascular:     Rate and Rhythm: Normal rate and regular rhythm.     Heart sounds: S1 normal and S2 normal. No murmur heard. Pulmonary:     Effort: Pulmonary effort is normal.      Breath sounds: Normal breath sounds and air entry. No rales.  Abdominal:     General: Abdomen is flat. Bowel sounds are normal.  Musculoskeletal:     Right lower leg: No edema.     Left lower leg: No edema.  Lymphadenopathy:     Cervical: No cervical adenopathy.  Neurological:     General: No focal deficit present.     Mental Status: He is alert and oriented to person, place, and time.     Gait: Gait is intact.  Psychiatric:        Mood and Affect: Mood and affect normal.         Assessment & Plan:    Routine Health Maintenance and Physical Exam  Immunization History  Administered Date(s) Administered   Hep A / Hep B 10/14/2015   Influenza,inj,Quad PF,6+ Mos 08/21/2015, 08/26/2017, 06/15/2018, 05/14/2019, 07/07/2020, 06/26/2021   Influenza-Unspecified 05/23/2016, 07/23/2022   MMR 02/28/2006   PFIZER(Purple Top)SARS-COV-2 Vaccination 11/10/2019, 12/01/2019, 08/01/2020, 07/23/2022   Td 02/28/2006   Tdap 06/11/2016    Health Maintenance  Topic Date Due   Colonoscopy  Never done   FOOT EXAM  09/28/2022   COVID-19 Vaccine (5 - 2023-24 season) 01/30/2023 (Originally 09/17/2022)   OPHTHALMOLOGY EXAM  04/16/2023 (Originally 02/15/2017)   Hepatitis C Screening  10/16/2023 (Originally 01/08/1993)   HIV Screening  10/16/2023 (Originally 01/08/1990)   INFLUENZA VACCINE  03/24/2023   HEMOGLOBIN A1C  07/17/2023   Diabetic kidney evaluation - eGFR measurement  01/14/2024   Diabetic kidney evaluation - Urine ACR  01/14/2024   DTaP/Tdap/Td (3 - Td or Tdap) 06/11/2026   HPV VACCINES  Aged Out    Discussed health benefits of physical activity, and encouraged him to engage in regular exercise appropriate for his age and condition.  Type 2 diabetes mellitus without complication, without long-term current use of insulin (HCC) Assessment & Plan: Uncontrolled despite changes in his diet. We had a long discussion, I advised that he restart the jardiance 25 mg once daily. We discussed the  risk of yeast infection, I advised that we would treat any infections that might arise while he is on this medication.   Orders: -     POCT glycosylated hemoglobin (Hb A1C) -     Lipid panel; Future -     Comprehensive metabolic panel -     Empagliflozin; Take 1 tablet (25 mg total) by mouth daily before breakfast.  Dispense: 90 tablet; Refill: 1 -     Microalbumin / creatinine urine ratio  Colon cancer screening -     Cologuard  Vitamin B12 deficiency -     B12 and Folate Panel  Vitamin D deficiency -     VITAMIN D 25 Hydroxy (  Vit-D Deficiency, Fractures)  Muscle cramps -     CK -     Magnesium Pt is reporting muscle cramps, will check magnesium, vitamin D and CK Routine general medical examination at a health care facility  Normal physical exam findings today, he will continue his low carb diet and increased exercise.   Return in about 3 months (around 04/16/2023) for DM.     Karie Georges, MD

## 2023-01-14 NOTE — Patient Instructions (Signed)
Health Maintenance, Male Adopting a healthy lifestyle and getting preventive care are important in promoting health and wellness. Ask your health care provider about: The right schedule for you to have regular tests and exams. Things you can do on your own to prevent diseases and keep yourself healthy. What should I know about diet, weight, and exercise? Eat a healthy diet  Eat a diet that includes plenty of vegetables, fruits, low-fat dairy products, and lean protein. Do not eat a lot of foods that are high in solid fats, added sugars, or sodium. Maintain a healthy weight Body mass index (BMI) is a measurement that can be used to identify possible weight problems. It estimates body fat based on height and weight. Your health care provider can help determine your BMI and help you achieve or maintain a healthy weight. Get regular exercise Get regular exercise. This is one of the most important things you can do for your health. Most adults should: Exercise for at least 150 minutes each week. The exercise should increase your heart rate and make you sweat (moderate-intensity exercise). Do strengthening exercises at least twice a week. This is in addition to the moderate-intensity exercise. Spend less time sitting. Even light physical activity can be beneficial. Watch cholesterol and blood lipids Have your blood tested for lipids and cholesterol at 48 years of age, then have this test every 5 years. You may need to have your cholesterol levels checked more often if: Your lipid or cholesterol levels are high. You are older than 48 years of age. You are at high risk for heart disease. What should I know about cancer screening? Many types of cancers can be detected early and may often be prevented. Depending on your health history and family history, you may need to have cancer screening at various ages. This may include screening for: Colorectal cancer. Prostate cancer. Skin cancer. Lung  cancer. What should I know about heart disease, diabetes, and high blood pressure? Blood pressure and heart disease High blood pressure causes heart disease and increases the risk of stroke. This is more likely to develop in people who have high blood pressure readings or are overweight. Talk with your health care provider about your target blood pressure readings. Have your blood pressure checked: Every 3-5 years if you are 18-39 years of age. Every year if you are 40 years old or older. If you are between the ages of 65 and 75 and are a current or former smoker, ask your health care provider if you should have a one-time screening for abdominal aortic aneurysm (AAA). Diabetes Have regular diabetes screenings. This checks your fasting blood sugar level. Have the screening done: Once every three years after age 45 if you are at a normal weight and have a low risk for diabetes. More often and at a younger age if you are overweight or have a high risk for diabetes. What should I know about preventing infection? Hepatitis B If you have a higher risk for hepatitis B, you should be screened for this virus. Talk with your health care provider to find out if you are at risk for hepatitis B infection. Hepatitis C Blood testing is recommended for: Everyone born from 1945 through 1965. Anyone with known risk factors for hepatitis C. Sexually transmitted infections (STIs) You should be screened each year for STIs, including gonorrhea and chlamydia, if: You are sexually active and are younger than 48 years of age. You are older than 48 years of age and your   health care provider tells you that you are at risk for this type of infection. Your sexual activity has changed since you were last screened, and you are at increased risk for chlamydia or gonorrhea. Ask your health care provider if you are at risk. Ask your health care provider about whether you are at high risk for HIV. Your health care provider  may recommend a prescription medicine to help prevent HIV infection. If you choose to take medicine to prevent HIV, you should first get tested for HIV. You should then be tested every 3 months for as long as you are taking the medicine. Follow these instructions at home: Alcohol use Do not drink alcohol if your health care provider tells you not to drink. If you drink alcohol: Limit how much you have to 0-2 drinks a day. Know how much alcohol is in your drink. In the [place] one drink equals one 12 oz bottle of beer (355 mL), one 5 oz glass of wine (148 mL), or one 1 oz glass of hard liquor (44 mL). Lifestyle Do not use any products that contain nicotine or tobacco. These products include cigarettes, chewing tobacco, and vaping devices, such as e-cigarettes. If you need help quitting, ask your health care provider. Do not use street drugs. Do not share needles. Ask your health care provider for help if you need support or information about quitting drugs. General instructions Schedule regular health, dental, and eye exams. Stay current with your vaccines. Tell your health care provider if: You often feel depressed. You have ever been abused or do not feel safe at home. Summary Adopting a healthy lifestyle and getting preventive care are important in promoting health and wellness. Follow your health care provider's instructions about healthy diet, exercising, and getting tested or screened for diseases. Follow your health care provider's instructions on monitoring your cholesterol and blood pressure. This information is not intended to replace advice given to you by your health care provider. Make sure you discuss any questions you have with your health care provider. Document Revised: 12/29/2020 Document Reviewed: 12/29/2020 Elsevier Patient Education  2024 [employer]  

## 2023-01-20 ENCOUNTER — Encounter: Payer: Self-pay | Admitting: Family Medicine

## 2023-01-20 NOTE — Assessment & Plan Note (Signed)
Uncontrolled despite changes in his diet. We had a long discussion, I advised that he restart the jardiance 25 mg once daily. We discussed the risk of yeast infection, I advised that we would treat any infections that might arise while he is on this medication.

## 2023-02-05 LAB — COLOGUARD: COLOGUARD: NEGATIVE

## 2023-03-23 ENCOUNTER — Encounter (INDEPENDENT_AMBULATORY_CARE_PROVIDER_SITE_OTHER): Payer: Self-pay

## 2023-04-18 ENCOUNTER — Ambulatory Visit: Payer: 59 | Admitting: Family Medicine

## 2023-06-17 ENCOUNTER — Ambulatory Visit (INDEPENDENT_AMBULATORY_CARE_PROVIDER_SITE_OTHER): Payer: 59 | Admitting: Family Medicine

## 2023-06-17 ENCOUNTER — Encounter: Payer: Self-pay | Admitting: Family Medicine

## 2023-06-17 VITALS — BP 132/88 | HR 85 | Temp 98.3°F | Ht 67.0 in | Wt 172.8 lb

## 2023-06-17 DIAGNOSIS — E119 Type 2 diabetes mellitus without complications: Secondary | ICD-10-CM

## 2023-06-17 DIAGNOSIS — Z7984 Long term (current) use of oral hypoglycemic drugs: Secondary | ICD-10-CM

## 2023-06-17 DIAGNOSIS — I10 Essential (primary) hypertension: Secondary | ICD-10-CM | POA: Diagnosis not present

## 2023-06-17 DIAGNOSIS — H66002 Acute suppurative otitis media without spontaneous rupture of ear drum, left ear: Secondary | ICD-10-CM

## 2023-06-17 DIAGNOSIS — B372 Candidiasis of skin and nail: Secondary | ICD-10-CM | POA: Diagnosis not present

## 2023-06-17 DIAGNOSIS — J069 Acute upper respiratory infection, unspecified: Secondary | ICD-10-CM

## 2023-06-17 LAB — POCT GLYCOSYLATED HEMOGLOBIN (HGB A1C): Hemoglobin A1C: 8 % — AB (ref 4.0–5.6)

## 2023-06-17 MED ORDER — ALBUTEROL SULFATE HFA 108 (90 BASE) MCG/ACT IN AERS
2.0000 | INHALATION_SPRAY | Freq: Four times a day (QID) | RESPIRATORY_TRACT | 2 refills | Status: AC | PRN
Start: 2023-06-17 — End: ?

## 2023-06-17 MED ORDER — LOSARTAN POTASSIUM 100 MG PO TABS: 100.0000 mg | ORAL_TABLET | Freq: Every day | ORAL | 1 refills | Status: DC

## 2023-06-17 MED ORDER — JANUMET 50-500 MG PO TABS: 1.0000 | ORAL_TABLET | Freq: Two times a day (BID) | ORAL | 1 refills | Status: DC

## 2023-06-17 MED ORDER — CLOTRIMAZOLE 1 % EX CREA
1.0000 | TOPICAL_CREAM | Freq: Two times a day (BID) | CUTANEOUS | 5 refills | Status: AC
Start: 2023-06-17 — End: ?

## 2023-06-17 MED ORDER — AMOXICILLIN 875 MG PO TABS
875.0000 mg | ORAL_TABLET | Freq: Two times a day (BID) | ORAL | 0 refills | Status: AC
Start: 2023-06-17 — End: 2023-06-24

## 2023-06-17 MED ORDER — EMPAGLIFLOZIN 25 MG PO TABS
25.0000 mg | ORAL_TABLET | Freq: Every day | ORAL | 1 refills | Status: DC
Start: 2023-06-17 — End: 2023-11-15

## 2023-06-17 NOTE — Assessment & Plan Note (Signed)
A1C is much improved on the jardiance, he is having some candidal intertrigo occasionally however this is well controlled with the clotrimazole cream. Will continue jardiance 25 mg daily and janumet 50/500 mg BID. Refilled the clotrimazole cream to help with the yeast outbreaks. RTC 6 months for annual visit.

## 2023-06-17 NOTE — Progress Notes (Signed)
Established Patient Office Visit  Subjective   Patient ID: Martin Sanchez, male    DOB: 1975/11/23  Age: 47 y.o. MRN: [id number]  Chief Complaint  Patient presents with   Medical Management of Chronic Issues   Headache    X3 days, tried Tylenol with no relief and Vicks steam with relief   Nasal Congestion    X6 days   Night Sweats    X2 days   Shortness of Breath    X3 days, denies cough, green-yellow-white sputum    Pt is here for follow up today  He has new symptoms of upper respiratory congestion, coughing and fever. He has been sick for about 5-6 days. Is developing sinus pressure, headache, night sweats. Yesterday and today fever got better, still feeling some headache and fogginess. Has bene using Afrin nasal spray and tylenol. Was using heating pad. Still feeling chest congestion.  Some SOB with exertion- did not test for COVID at the time.   DM-- A1C performed in office today and is much improved, he reports that he is having to use the antifungal cream about every 3-4 weeks. Reports that he is staying hydrated as much as possible. Foot exam performed today and is normal. He was reminded to get his diabetic eye exam soon.   HTN -- BP in office performed and is well controlled. He  reports no side effects to the medications, no chest pain, SOB, dizziness or headaches. He has a BP cuff at home and is checking BP regularly, reports they are in the normal range.        Current Outpatient Medications  Medication Instructions   albuterol (VENTOLIN HFA) 108 (90 Base) MCG/ACT inhaler 2 puffs, Inhalation, Every 6 hours PRN   amoxicillin (AMOXIL) 875 mg, Oral, 2 times daily   blood glucose meter kit and supplies KIT Dispense based on patient and insurance preference. Use up to four times daily as directed. (FOR ICD-9 250.00, 250.01).   clotrimazole (LOTRIMIN) 1 % cream 1 Application, Topical, 2 times daily   empagliflozin (JARDIANCE) 25 mg, Oral, Daily before breakfast    Fluocinolone Acetonide Scalp 0.01 % OIL 1 application , Apply externally, Daily PRN   losartan (COZAAR) 100 mg, Oral, Daily   rosuvastatin (CRESTOR) 10 mg, Oral, Daily   sitaGLIPtin-metformin (JANUMET) 50-500 MG tablet 1 tablet, Oral, 2 times daily with meals   triamcinolone cream (KENALOG) 0.1 % Topical, 2 times daily, APPLY 1 APPLICATION TOPICALLY TWICE A DAY    Patient Active Problem List   Diagnosis Date Noted   Adenopathy 07/24/2018   LAD (lymphadenopathy), inguinal 06/16/2017   HLD (hyperlipidemia) 03/25/2017   HTN (hypertension) 06/11/2016   BMI 28.0-28.9,adult 06/11/2016   Type 2 diabetes mellitus without complication, without long-term current use of insulin (HCC) 03/08/2016   Carpal tunnel syndrome of right wrist 11/24/2010      Review of Systems  All other systems reviewed and are negative.     Objective:     BP 132/88 (BP Location: Left Arm, Patient Position: Sitting, Cuff Size: Normal)   Pulse 85   Temp 98.3 F (36.8 C) (Oral)   Ht 5\' 7"  (1.702 m)   Wt 172 lb 12.8 oz (78.4 kg)   SpO2 100%   BMI 27.06 kg/m    Physical Exam Vitals reviewed.  Constitutional:      Appearance: Normal appearance. He is well-groomed and normal weight.  HENT:     Right Ear: Tympanic membrane normal.     Left Ear:  Tympanic membrane is erythematous and bulging.  Eyes:     Extraocular Movements: Extraocular movements intact.     Conjunctiva/sclera: Conjunctivae normal.  Neck:     Thyroid: No thyromegaly.  Cardiovascular:     Rate and Rhythm: Normal rate and regular rhythm.     Heart sounds: S1 normal and S2 normal. No murmur heard. Pulmonary:     Effort: Pulmonary effort is normal.     Breath sounds: Normal breath sounds and air entry. No rales.  Abdominal:     General: Abdomen is flat. Bowel sounds are normal.  Musculoskeletal:     Right lower leg: No edema.     Left lower leg: No edema.  Neurological:     General: No focal deficit present.     Mental Status: He is  alert and oriented to person, place, and time.     Gait: Gait is intact.  Psychiatric:        Mood and Affect: Mood and affect normal.      Results for orders placed or performed in visit on 06/17/23  POC HgB A1c  Result Value Ref Range   Hemoglobin A1C 8.0 (A) 4.0 - 5.6 %   HbA1c POC (<> result, manual entry)     HbA1c, POC (prediabetic range)     HbA1c, POC (controlled diabetic range)        The 10-year ASCVD risk score (Arnett DK, et al., 2019) is: 5.3%    Assessment & Plan:  Type 2 diabetes mellitus without complication, without long-term current use of insulin (HCC) Assessment & Plan: A1C is much improved on the jardiance, he is having some candidal intertrigo occasionally however this is well controlled with the clotrimazole cream. Will continue jardiance 25 mg daily and janumet 50/500 mg BID. Refilled the clotrimazole cream to help with the yeast outbreaks. RTC 6 months for annual visit.   Orders: -     POCT glycosylated hemoglobin (Hb A1C) -     Janumet; Take 1 tablet by mouth 2 (two) times daily with a meal.  Dispense: 180 tablet; Refill: 1 -     Empagliflozin; Take 1 tablet (25 mg total) by mouth daily before breakfast.  Dispense: 90 tablet; Refill: 1  Candidal intertrigo -     Clotrimazole; Apply 1 Application topically 2 (two) times daily.  Dispense: 35 g; Refill: 5  Primary hypertension Assessment & Plan: Chronic, stable, BP is well controlled today on losartan 100 mg daily. Will continue this medication as prescribed  Orders: -     Losartan Potassium; Take 1 tablet (100 mg total) by mouth daily.  Dispense: 90 tablet; Refill: 1  Non-recurrent acute suppurative otitis media of left ear without spontaneous rupture of tympanic membrane Seen on exam today, likely started out as a viral infection with secondary bacterial complication. Will treat with abx listed below.   -     Amoxicillin; Take 1 tablet (875 mg total) by mouth 2 (two) times daily for 7 days.   Dispense: 14 tablet; Refill: 0  Viral upper respiratory tract infection -     Albuterol Sulfate HFA; Inhale 2 puffs into the lungs every 6 (six) hours as needed for wheezing or shortness of breath.  Dispense: 8.5 each; Refill: 2     Return in about 6 months (around 12/16/2023) for annual physical exam.    Karie Georges, MD

## 2023-06-17 NOTE — Assessment & Plan Note (Signed)
Chronic, stable, BP is well controlled today on losartan 100 mg daily. Will continue this medication as prescribed

## 2023-08-20 ENCOUNTER — Other Ambulatory Visit: Payer: Self-pay | Admitting: Family Medicine

## 2023-08-20 DIAGNOSIS — E1169 Type 2 diabetes mellitus with other specified complication: Secondary | ICD-10-CM

## 2023-11-07 ENCOUNTER — Other Ambulatory Visit: Payer: Self-pay | Admitting: Family Medicine

## 2023-11-07 DIAGNOSIS — E119 Type 2 diabetes mellitus without complications: Secondary | ICD-10-CM

## 2023-11-07 DIAGNOSIS — I10 Essential (primary) hypertension: Secondary | ICD-10-CM

## 2023-11-15 ENCOUNTER — Other Ambulatory Visit: Payer: Self-pay | Admitting: Family Medicine

## 2023-11-15 DIAGNOSIS — E119 Type 2 diabetes mellitus without complications: Secondary | ICD-10-CM

## 2023-12-06 ENCOUNTER — Other Ambulatory Visit: Payer: Self-pay | Admitting: Family Medicine

## 2023-12-06 ENCOUNTER — Encounter: Payer: Self-pay | Admitting: Family Medicine

## 2023-12-06 DIAGNOSIS — I10 Essential (primary) hypertension: Secondary | ICD-10-CM

## 2023-12-06 DIAGNOSIS — E119 Type 2 diabetes mellitus without complications: Secondary | ICD-10-CM

## 2023-12-06 DIAGNOSIS — B372 Candidiasis of skin and nail: Secondary | ICD-10-CM

## 2023-12-07 NOTE — Telephone Encounter (Signed)
**Note De-identified  [place] Obfuscation** Please advise 

## 2023-12-08 MED ORDER — JANUMET 50-500 MG PO TABS: 1.0000 | ORAL_TABLET | Freq: Two times a day (BID) | ORAL | 0 refills | Status: DC

## 2023-12-08 MED ORDER — EMPAGLIFLOZIN 25 MG PO TABS: 25.0000 mg | ORAL_TABLET | Freq: Every day | ORAL | 0 refills | Status: DC

## 2023-12-08 MED ORDER — LOSARTAN POTASSIUM 100 MG PO TABS
100.0000 mg | ORAL_TABLET | Freq: Every day | ORAL | 0 refills | Status: DC
Start: 2023-12-08 — End: 2024-01-20

## 2023-12-08 NOTE — Telephone Encounter (Signed)
 Pt has a CPE in a month. Refilled Rx for 1 month.

## 2023-12-12 MED ORDER — FLUCONAZOLE 150 MG PO TABS: 150.0000 mg | ORAL_TABLET | ORAL | 0 refills | Status: AC | PRN

## 2024-01-20 ENCOUNTER — Encounter: Payer: Self-pay | Admitting: Family Medicine

## 2024-01-20 ENCOUNTER — Ambulatory Visit: Payer: 59 | Admitting: Family Medicine

## 2024-01-20 ENCOUNTER — Ambulatory Visit: Payer: Self-pay | Admitting: Family Medicine

## 2024-01-20 VITALS — BP 140/90 | HR 93 | Temp 97.8°F | Ht 67.0 in | Wt 171.4 lb

## 2024-01-20 DIAGNOSIS — E119 Type 2 diabetes mellitus without complications: Secondary | ICD-10-CM | POA: Diagnosis not present

## 2024-01-20 DIAGNOSIS — I10 Essential (primary) hypertension: Secondary | ICD-10-CM

## 2024-01-20 LAB — POCT GLYCOSYLATED HEMOGLOBIN (HGB A1C): Hemoglobin A1C: 8.3 % — AB (ref 4.0–5.6)

## 2024-01-20 MED ORDER — LOSARTAN POTASSIUM 100 MG PO TABS: 100.0000 mg | ORAL_TABLET | Freq: Every day | ORAL | 1 refills | Status: DC

## 2024-01-20 MED ORDER — JANUMET 50-500 MG PO TABS: 1.0000 | ORAL_TABLET | Freq: Two times a day (BID) | ORAL | 1 refills | Status: DC

## 2024-01-20 MED ORDER — EMPAGLIFLOZIN 25 MG PO TABS: 25.0000 mg | ORAL_TABLET | Freq: Every day | ORAL | 1 refills | Status: DC

## 2024-01-30 NOTE — Progress Notes (Signed)
 Visit canceled

## 2024-02-10 ENCOUNTER — Ambulatory Visit: Admitting: Family Medicine

## 2024-02-10 ENCOUNTER — Encounter: Payer: Self-pay | Admitting: Family Medicine

## 2024-02-10 VITALS — BP 140/90 | HR 78 | Temp 97.8°F | Ht 67.0 in | Wt 173.4 lb

## 2024-02-10 DIAGNOSIS — Z0000 Encounter for general adult medical examination without abnormal findings: Secondary | ICD-10-CM

## 2024-02-10 DIAGNOSIS — I10 Essential (primary) hypertension: Secondary | ICD-10-CM

## 2024-02-10 DIAGNOSIS — E559 Vitamin D deficiency, unspecified: Secondary | ICD-10-CM

## 2024-02-10 DIAGNOSIS — Z125 Encounter for screening for malignant neoplasm of prostate: Secondary | ICD-10-CM | POA: Diagnosis not present

## 2024-02-10 DIAGNOSIS — E119 Type 2 diabetes mellitus without complications: Secondary | ICD-10-CM

## 2024-02-10 DIAGNOSIS — E538 Deficiency of other specified B group vitamins: Secondary | ICD-10-CM | POA: Diagnosis not present

## 2024-02-10 LAB — VITAMIN B12: Vitamin B-12: 387 pg/mL (ref 211–911)

## 2024-02-10 LAB — COMPREHENSIVE METABOLIC PANEL WITH GFR
ALT: 22 U/L (ref 0–53)
AST: 20 U/L (ref 0–37)
Albumin: 4.9 g/dL (ref 3.5–5.2)
Alkaline Phosphatase: 51 U/L (ref 39–117)
BUN: 13 mg/dL (ref 6–23)
CO2: 29 meq/L (ref 19–32)
Calcium: 9.9 mg/dL (ref 8.4–10.5)
Chloride: 97 meq/L (ref 96–112)
Creatinine, Ser: 0.8 mg/dL (ref 0.40–1.50)
GFR: 104.23 mL/min (ref >60.00)
Glucose, Bld: 144 mg/dL — ABNORMAL HIGH (ref 70–99)
Potassium: 4.7 meq/L (ref 3.5–5.1)
Sodium: 137 meq/L (ref 135–145)
Total Bilirubin: 0.7 mg/dL (ref 0.2–1.2)
Total Protein: 7.2 g/dL (ref 6.0–8.3)

## 2024-02-10 LAB — CBC WITH DIFFERENTIAL/PLATELET
Basophils Absolute: 0.1 10*3/uL (ref 0.0–0.1)
Basophils Relative: 1.1 % (ref 0.0–3.0)
Eosinophils Absolute: 0.4 10*3/uL (ref 0.0–0.7)
Eosinophils Relative: 4.9 % (ref 0.0–5.0)
HCT: 51.3 % (ref 39.0–52.0)
Hemoglobin: 16.7 g/dL (ref 13.0–17.0)
Lymphocytes Relative: 37.1 % (ref 12.0–46.0)
Lymphs Abs: 2.9 10*3/uL (ref 0.7–4.0)
MCHC: 32.5 g/dL (ref 30.0–36.0)
MCV: 79.3 fl (ref 78.0–100.0)
Monocytes Absolute: 0.5 10*3/uL (ref 0.1–1.0)
Monocytes Relative: 6.4 % (ref 3.0–12.0)
Neutro Abs: 3.9 10*3/uL (ref 1.4–7.7)
Neutrophils Relative %: 50.5 % (ref 43.0–77.0)
Platelets: 282 10*3/uL (ref 150.0–400.0)
RBC: 6.48 Mil/uL — ABNORMAL HIGH (ref 4.22–5.81)
RDW: 14.7 % (ref 11.5–15.5)
WBC: 7.7 10*3/uL (ref 4.0–10.5)

## 2024-02-10 LAB — MICROALBUMIN / CREATININE URINE RATIO
Creatinine,U: 54.2 mg/dL
Microalb Creat Ratio: UNDETERMINED mg/g (ref 0.0–30.0)
Microalb, Ur: <0.7 mg/dL

## 2024-02-10 LAB — LIPID PANEL
Cholesterol: 180 mg/dL (ref 0–200)
HDL: 45 mg/dL (ref >39.00)
LDL Cholesterol: 98 mg/dL (ref 0–99)
NonHDL: 134.93
Total CHOL/HDL Ratio: 4
Triglycerides: 183 mg/dL — ABNORMAL HIGH (ref 0.0–149.0)
VLDL: 36.6 mg/dL (ref 0.0–40.0)

## 2024-02-10 LAB — TSH: TSH: 2.41 u[IU]/mL (ref 0.35–5.50)

## 2024-02-10 LAB — VITAMIN D 25 HYDROXY (VIT D DEFICIENCY, FRACTURES): VITD: 26.57 ng/mL — ABNORMAL LOW (ref 30.00–100.00)

## 2024-02-10 LAB — PSA: PSA: 0.33 ng/mL (ref 0.10–4.00)

## 2024-02-10 NOTE — Patient Instructions (Addendum)
 Ibuprofen 600 -800 mg every 8 hours as needed for joint pain (take with food)

## 2024-02-10 NOTE — Progress Notes (Signed)
 Complete physical exam  Patient: Martin Sanchez   DOB: 03/24/1976   48 y.o. Male  MRN: [id number]  Subjective:    Chief Complaint  Patient presents with   Annual Exam    Pt is fasting. Pt would like his 1 y/o son to be establish with Dr. Bambi Lever.     Martin Sanchez is a 48 y.o. male who presents today for a complete physical exam. He reports consuming a general diet. Home exercise routine includes walking 1-2 hrs per week. He generally feels well. He reports sleeping fairly well, has occasional nighttime awakenings where he cannot fall back asleep. He does not have additional problems to discuss today.    Most recent fall risk assessment:     No data to display           Most recent depression screenings:    01/20/2024    8:02 AM 06/17/2023    8:22 AM  PHQ 2/9 Scores  PHQ - 2 Score 0 0  PHQ- 9 Score 0 0    Vision:Within last year and Dental: No current dental problems and Receives regular dental care  Patient Active Problem List   Diagnosis Date Noted   Adenopathy 07/24/2018   LAD (lymphadenopathy), inguinal 06/16/2017   HLD (hyperlipidemia) 03/25/2017   HTN (hypertension) 06/11/2016   BMI 28.0-28.9,adult 06/11/2016   Type 2 diabetes mellitus without complication, without long-term current use of insulin (HCC) 03/08/2016   Carpal tunnel syndrome of right wrist 11/24/2010      Patient Care Team: Aida House, MD as PCP - General (Family Medicine) Sonja , MD as Consulting Physician (Hematology)   Outpatient Medications Prior to Visit  Medication Sig   albuterol  (VENTOLIN  HFA) 108 (90 Base) MCG/ACT inhaler Inhale 2 puffs into the lungs every 6 (six) hours as needed for wheezing or shortness of breath.   blood glucose meter kit and supplies KIT Dispense based on patient and insurance preference. Use up to four times daily as directed. (FOR ICD-9 250.00, 250.01).   clotrimazole  (LOTRIMIN ) 1 % cream Apply 1 Application topically 2 (two) times daily.    empagliflozin  (JARDIANCE ) 25 MG TABS tablet Take 1 tablet (25 mg total) by mouth daily before breakfast.   fluconazole  (DIFLUCAN ) 150 MG tablet Take 1 tablet (150 mg total) by mouth every three (3) days as needed.   Fluocinolone  Acetonide Scalp 0.01 % OIL Apply 1 application topically daily as needed.   losartan  (COZAAR ) 100 MG tablet Take 1 tablet (100 mg total) by mouth daily.   rosuvastatin  (CRESTOR ) 10 MG tablet TAKE 1 TABLET BY MOUTH DAILY   sitaGLIPtin -metformin  (JANUMET ) 50-500 MG tablet Take 1 tablet by mouth 2 (two) times daily with a meal.   triamcinolone  cream (KENALOG ) 0.1 % Apply topically 2 (two) times daily. APPLY 1 APPLICATION TOPICALLY TWICE A DAY   No facility-administered medications prior to visit.    Review of Systems  HENT:  Negative for hearing loss.   Eyes:  Negative for blurred vision.  Respiratory:  Negative for shortness of breath.   Cardiovascular:  Negative for chest pain.  Gastrointestinal: Negative.   Genitourinary: Negative.   Musculoskeletal:  Negative for back pain.  Neurological:  Negative for headaches.  Psychiatric/Behavioral:  Negative for depression.        Objective:     BP (!) 140/90 (BP Location: Left Arm, Patient Position: Sitting, Cuff Size: Normal)   Pulse 78   Temp 97.8 F (36.6 C) (Oral)   Ht 5' 7 (1.702  m)   Wt 173 lb 6.4 oz (78.7 kg)   SpO2 98%   BMI 27.16 kg/m    Physical Exam Vitals reviewed.  Constitutional:      Appearance: Normal appearance. He is well-groomed and normal weight.  HENT:     Right Ear: Tympanic membrane and ear canal normal.     Left Ear: Tympanic membrane and ear canal normal.     Mouth/Throat:     Mouth: Mucous membranes are moist.     Pharynx: No posterior oropharyngeal erythema.   Eyes:     Extraocular Movements: Extraocular movements intact.     Conjunctiva/sclera: Conjunctivae normal.   Neck:     Thyroid : No thyromegaly.   Cardiovascular:     Rate and Rhythm: Normal rate and regular  rhythm.     Heart sounds: S1 normal and S2 normal. No murmur heard. Pulmonary:     Effort: Pulmonary effort is normal.     Breath sounds: Normal breath sounds and air entry. No rales.  Abdominal:     General: Abdomen is flat. Bowel sounds are normal.   Musculoskeletal:     Right lower leg: No edema.     Left lower leg: No edema.  Lymphadenopathy:     Cervical: No cervical adenopathy.   Neurological:     General: No focal deficit present.     Mental Status: He is alert and oriented to person, place, and time.     Gait: Gait is intact.   Psychiatric:        Mood and Affect: Mood and affect normal.      No results found for any visits on 02/10/24.     Assessment & Plan:    Routine Health Maintenance and Physical Exam  Immunization History  Administered Date(s) Administered   Hep A / Hep B 10/14/2015   Influenza Inj Mdck Quad Pf 07/22/2022   Influenza,inj,Quad PF,6+ Mos 08/21/2015, 08/26/2017, 06/15/2018, 05/14/2019, 07/07/2020, 06/26/2021   Influenza-Unspecified 05/23/2016, 07/23/2022   MMR 02/28/2006   PFIZER(Purple Top)SARS-COV-2 Vaccination 11/10/2019, 12/01/2019, 08/01/2020, 07/23/2022   Pfizer(Comirnaty)Fall Seasonal Vaccine 12 years and older 07/20/2022   Td 02/28/2006   Tdap 06/11/2016    Health Maintenance  Topic Date Due   Pneumococcal Vaccine 6-79 Years old (1 of 2 - PCV) Never done   Diabetic kidney evaluation - eGFR measurement  01/14/2024   Diabetic kidney evaluation - Urine ACR  01/14/2024   COVID-19 Vaccine (5 - 2024-25 season) 02/26/2024 (Originally 04/24/2023)   OPHTHALMOLOGY EXAM  06/01/2024 (Originally 02/15/2017)   Hepatitis C Screening  01/19/2025 (Originally 01/08/1993)   HIV Screening  01/19/2025 (Originally 01/08/1990)   INFLUENZA VACCINE  03/23/2024   FOOT EXAM  06/16/2024   HEMOGLOBIN A1C  07/22/2024   Fecal DNA (Cologuard)  01/30/2026   DTaP/Tdap/Td (3 - Td or Tdap) 06/11/2026   HPV VACCINES  Aged Out   Meningococcal B Vaccine  Aged Out    Colonoscopy  Discontinued    Discussed health benefits of physical activity, and encouraged him to engage in regular exercise appropriate for his age and condition.  Routine general medical examination at a health care facility -     CBC with Differential/Platelet; Future -     Comprehensive metabolic panel with GFR; Future -     Lipid panel; Future -     Microalbumin / creatinine urine ratio; Future  Type 2 diabetes mellitus without complication, without long-term current use of insulin (HCC) -     CBC with Differential/Platelet; Future -  Comprehensive metabolic panel with GFR; Future -     Lipid panel; Future -     Microalbumin / creatinine urine ratio; Future  Primary hypertension -     Comprehensive metabolic panel with GFR; Future -     TSH; Future  Vitamin B12 deficiency -     Vitamin B12; Future  Vitamin D  deficiency -     VITAMIN D  25 Hydroxy (Vit-D Deficiency, Fractures); Future  Prostate cancer screening -     PSA; Future  Normal physical exam findings. I counseled the patient on the recommended amount of exercise per CDC recommendation. I reviewed preventative screening, immunizations, and medical history and updated in the chart, and appropriate labs and vaccinations were ordered. Handouts given on healthy eating and exercise.    Return in about 6 months (around 08/11/2024) for DM, HTN.     Aida House, MD

## 2024-02-17 ENCOUNTER — Ambulatory Visit: Payer: Self-pay | Admitting: Family Medicine

## 2024-06-30 ENCOUNTER — Other Ambulatory Visit: Payer: Self-pay | Admitting: Family Medicine

## 2024-06-30 DIAGNOSIS — E1169 Type 2 diabetes mellitus with other specified complication: Secondary | ICD-10-CM

## 2024-07-10 ENCOUNTER — Other Ambulatory Visit: Payer: Self-pay | Admitting: Family Medicine

## 2024-07-10 DIAGNOSIS — I10 Essential (primary) hypertension: Secondary | ICD-10-CM

## 2024-07-10 DIAGNOSIS — E119 Type 2 diabetes mellitus without complications: Secondary | ICD-10-CM
